# Patient Record
Sex: Male | Born: 1965 | Race: Black or African American | Hispanic: No | Marital: Single | Smoking: Light tobacco smoker
Health system: Southern US, Community
[De-identification: ages and names within clinical notes are randomized; demographics above are authoritative.]

## PROBLEM LIST (undated history)

## (undated) DIAGNOSIS — I10 Essential (primary) hypertension: Secondary | ICD-10-CM

## (undated) DIAGNOSIS — F639 Impulse disorder, unspecified: Secondary | ICD-10-CM

## (undated) DIAGNOSIS — R569 Unspecified convulsions: Secondary | ICD-10-CM

## (undated) DIAGNOSIS — E119 Type 2 diabetes mellitus without complications: Secondary | ICD-10-CM

## (undated) DIAGNOSIS — F79 Unspecified intellectual disabilities: Secondary | ICD-10-CM

## (undated) DIAGNOSIS — F602 Antisocial personality disorder: Secondary | ICD-10-CM

## (undated) DIAGNOSIS — F919 Conduct disorder, unspecified: Secondary | ICD-10-CM

## (undated) DIAGNOSIS — F71 Moderate intellectual disabilities: Secondary | ICD-10-CM

## (undated) DIAGNOSIS — N183 Chronic kidney disease, stage 3 unspecified: Secondary | ICD-10-CM

## (undated) DIAGNOSIS — F809 Developmental disorder of speech and language, unspecified: Secondary | ICD-10-CM

## (undated) DIAGNOSIS — J4520 Mild intermittent asthma, uncomplicated: Secondary | ICD-10-CM

## (undated) HISTORY — DX: Mild intermittent asthma, uncomplicated: J45.20

## (undated) HISTORY — DX: Essential (primary) hypertension: I10

## (undated) HISTORY — DX: Type 2 diabetes mellitus without complications: E11.9

## (undated) HISTORY — DX: Chronic kidney disease, stage 3 unspecified: N18.30

## (undated) HISTORY — DX: Chronic kidney disease, stage 3 (moderate): N18.3

---

## 1999-09-06 ENCOUNTER — Emergency Department (HOSPITAL_COMMUNITY): Admission: EM | Admit: 1999-09-06 | Discharge: 1999-09-06 | Payer: Self-pay | Admitting: Emergency Medicine

## 1999-09-27 ENCOUNTER — Emergency Department (HOSPITAL_COMMUNITY): Admission: EM | Admit: 1999-09-27 | Discharge: 1999-09-27 | Payer: Self-pay

## 2000-10-25 ENCOUNTER — Encounter: Payer: Self-pay | Admitting: Specialist

## 2000-10-25 ENCOUNTER — Encounter: Payer: Self-pay | Admitting: Emergency Medicine

## 2000-10-25 ENCOUNTER — Emergency Department (HOSPITAL_COMMUNITY): Admission: EM | Admit: 2000-10-25 | Discharge: 2000-10-25 | Payer: Self-pay | Admitting: Emergency Medicine

## 2002-09-11 ENCOUNTER — Emergency Department (HOSPITAL_COMMUNITY): Admission: EM | Admit: 2002-09-11 | Discharge: 2002-09-11 | Payer: Self-pay | Admitting: Emergency Medicine

## 2002-12-04 ENCOUNTER — Emergency Department (HOSPITAL_COMMUNITY): Admission: EM | Admit: 2002-12-04 | Discharge: 2002-12-04 | Payer: Self-pay | Admitting: Emergency Medicine

## 2004-12-14 ENCOUNTER — Emergency Department (HOSPITAL_COMMUNITY): Admission: EM | Admit: 2004-12-14 | Discharge: 2004-12-14 | Payer: Self-pay | Admitting: Emergency Medicine

## 2005-11-20 ENCOUNTER — Emergency Department (HOSPITAL_COMMUNITY): Admission: EM | Admit: 2005-11-20 | Discharge: 2005-11-20 | Payer: Self-pay | Admitting: Emergency Medicine

## 2006-03-09 ENCOUNTER — Ambulatory Visit (HOSPITAL_BASED_OUTPATIENT_CLINIC_OR_DEPARTMENT_OTHER): Admission: RE | Admit: 2006-03-09 | Discharge: 2006-03-09 | Payer: Self-pay | Admitting: Family Medicine

## 2006-03-14 ENCOUNTER — Ambulatory Visit: Payer: Self-pay | Admitting: Internal Medicine

## 2006-05-12 ENCOUNTER — Encounter: Admission: RE | Admit: 2006-05-12 | Discharge: 2006-08-10 | Payer: Self-pay | Admitting: Family Medicine

## 2006-08-13 ENCOUNTER — Encounter: Admission: RE | Admit: 2006-08-13 | Discharge: 2006-11-11 | Payer: Self-pay | Admitting: Family Medicine

## 2008-10-11 ENCOUNTER — Encounter: Admission: RE | Admit: 2008-10-11 | Discharge: 2008-10-11 | Payer: Self-pay | Admitting: Family Medicine

## 2009-01-31 ENCOUNTER — Emergency Department (HOSPITAL_COMMUNITY): Admission: EM | Admit: 2009-01-31 | Discharge: 2009-01-31 | Payer: Self-pay | Admitting: Family Medicine

## 2009-10-08 ENCOUNTER — Emergency Department (HOSPITAL_COMMUNITY): Admission: EM | Admit: 2009-10-08 | Discharge: 2009-10-08 | Payer: Self-pay | Admitting: Emergency Medicine

## 2010-01-10 ENCOUNTER — Emergency Department (HOSPITAL_COMMUNITY): Admission: EM | Admit: 2010-01-10 | Discharge: 2010-01-10 | Payer: Self-pay | Admitting: Emergency Medicine

## 2010-10-17 NOTE — Procedures (Signed)
NAME:  Mark Escobar, Mark Escobar               ACCOUNT NO.:  [id number]   MEDICAL RECORD NO.:  [id number]          PATIENT TYPE:  OUT   LOCATION:  SLEEP CENTER                 FACILITY:  [facility]   PHYSICIAN:  Clinton D. Maple Hudson, MD, FCCP, FACPDATE OF BIRTH:  08/30/65   DATE OF STUDY:  03/09/2006                              NOCTURNAL POLYSOMNOGRAM   REFERRING PHYSICIAN:  Dr. Windle Guard   INDICATION FOR STUDY:  Hypersomnia with sleep apnea.   EPWORTH SLEEPINESS SCORE:  11/24, BMI 34.9, weight 210 pounds.   MEDICATION LIST:  Is charted.   SLEEP ARCHITECTURE:  Total sleep time 409 minute with sleep efficiency 87%.  Stage 1 was 18%, stage 2 62%, stages 3 and 4 were absent.  Sleep latency was  30 minutes, REM latency was 120 minutes, awake after sleep onset 32 minutes,  arousal index 28.5 which is somewhat increased, indicating increased sleep  fragmentation.  No bedtime medication was reported.   RESPIRATORY DATA:  Split study protocol.  Apnea/hypopnea index (AHI, RDI)  94.9 obstructive events per hour, indicating severe obstructive sleep  apnea/hypopnea syndrome before CPAP.  This included 156 obstructive apneas  and 59 hypopneas before CPAP.  The events were not positional.  REM AHI  14.5.  CPAP was titrated to 12 CWP for control of obstructive events, AHI  zero per hour.  The technician took the pressure up to 24 CWP trying to  completely stop snoring.   OXYGEN DATA:  Loud snoring with oxygen desaturation to a nadir of 58%.  After CPAP control, saturation held 92-95% on room air.   CARDIAC DATA:  Normal sinus rhythm with occasional PVC and PAC.   MOVEMENT/PARASOMNIA:  Occasional limb jerk, insignificant.   IMPRESSION/RECOMMENDATION:  1. Severe obstructive sleep apnea/hypopnea syndrome, apnea/hypopnea index      94.9 per hour with nonpositional events, loud snoring, and oxygen      desaturation to 58%.  2. Successful CPAP titration.  Recommend initial trial at 14 CWP which      should  be sufficient to control obstructive events, normalize oxygen      saturation, and substantially reduce snoring.  Higher pressures are      likely      to be uncomfortable beyond benefit to patient.  A medium Comfort full      face mask was used with heated humidifier.      Clinton D. Maple Hudson, MD, [facility], FACP  Diplomate, Biomedical engineer of Sleep Medicine  Electronically Signed     CDY/MEDQ  D:  03/14/2006 11:05:24  T:  03/15/2006 12:13:09  Job:  [id number]

## 2014-02-07 ENCOUNTER — Encounter (HOSPITAL_COMMUNITY): Payer: Self-pay | Admitting: Emergency Medicine

## 2014-02-07 ENCOUNTER — Emergency Department (HOSPITAL_COMMUNITY)
Admission: EM | Admit: 2014-02-07 | Discharge: 2014-02-08 | Disposition: A | Discharge status: Home / Self Care | Payer: Medicare Other | Attending: Emergency Medicine | Admitting: Emergency Medicine

## 2014-02-07 DIAGNOSIS — IMO0002 Reserved for concepts with insufficient information to code with codable children: Secondary | ICD-10-CM | POA: Insufficient documentation

## 2014-02-07 DIAGNOSIS — F17200 Nicotine dependence, unspecified, uncomplicated: Secondary | ICD-10-CM | POA: Insufficient documentation

## 2014-02-07 DIAGNOSIS — Z8669 Personal history of other diseases of the nervous system and sense organs: Secondary | ICD-10-CM | POA: Diagnosis not present

## 2014-02-07 DIAGNOSIS — R45851 Suicidal ideations: Secondary | ICD-10-CM | POA: Insufficient documentation

## 2014-02-07 DIAGNOSIS — R451 Restlessness and agitation: Secondary | ICD-10-CM

## 2014-02-07 DIAGNOSIS — F4324 Adjustment disorder with disturbance of conduct: Secondary | ICD-10-CM

## 2014-02-07 HISTORY — DX: Unspecified intellectual disabilities: F79

## 2014-02-07 HISTORY — DX: Moderate intellectual disabilities: F71

## 2014-02-07 HISTORY — DX: Unspecified convulsions: R56.9

## 2014-02-07 HISTORY — DX: Conduct disorder, unspecified: F91.9

## 2014-02-07 HISTORY — DX: Developmental disorder of speech and language, unspecified: F80.9

## 2014-02-07 HISTORY — DX: Impulse disorder, unspecified: F63.9

## 2014-02-07 HISTORY — DX: Antisocial personality disorder: F60.2

## 2014-02-07 LAB — RAPID URINE DRUG SCREEN, HOSP PERFORMED
Amphetamines: NOT DETECTED
BARBITURATES: NOT DETECTED
Benzodiazepines: NOT DETECTED
Cocaine: NOT DETECTED
Opiates: NOT DETECTED
Tetrahydrocannabinol: NOT DETECTED

## 2014-02-07 LAB — I-STAT CHEM 8, ED
BUN: 8 mg/dL (ref 6–23)
CHLORIDE: 106 meq/L (ref 96–112)
Calcium, Ion: 1.14 mmol/L (ref 1.12–1.23)
Creatinine, Ser: 0.8 mg/dL (ref 0.50–1.35)
Glucose, Bld: 204 mg/dL — ABNORMAL HIGH (ref 70–99)
HCT: 43 % (ref 39.0–52.0)
Hemoglobin: 14.6 g/dL (ref 13.0–17.0)
Potassium: 3.7 mEq/L (ref 3.7–5.3)
Sodium: 142 mEq/L (ref 137–147)
TCO2: 23 mmol/L (ref 0–100)

## 2014-02-07 LAB — ETHANOL: Alcohol, Ethyl (B): <11 mg/dL (ref 0–11)

## 2014-02-07 MED ORDER — ACETAMINOPHEN 325 MG PO TABS: 650.0000 mg | ORAL_TABLET | ORAL | Status: DC | PRN

## 2014-02-07 MED ORDER — IBUPROFEN 200 MG PO TABS: 400.0000 mg | ORAL_TABLET | Freq: Three times a day (TID) | ORAL | Status: DC | PRN

## 2014-02-07 MED ORDER — ALUM & MAG HYDROXIDE-SIMETH 200-200-20 MG/5ML PO SUSP: 30.0000 mL | ORAL | Status: DC | PRN

## 2014-02-07 MED ORDER — NICOTINE 21 MG/24HR TD PT24: 21.0000 mg | MEDICATED_PATCH | Freq: Every day | TRANSDERMAL | Status: DC

## 2014-02-07 MED ORDER — LORAZEPAM 1 MG PO TABS: 1.0000 mg | ORAL_TABLET | Freq: Three times a day (TID) | ORAL | Status: DC | PRN

## 2014-02-07 MED ORDER — ONDANSETRON HCL 4 MG PO TABS: 4.0000 mg | ORAL_TABLET | Freq: Three times a day (TID) | ORAL | Status: DC | PRN

## 2014-02-07 NOTE — ED Notes (Signed)
Group Home: [facility] [employer] [facility]) [phone] (cell) [phone] (office) [place]

## 2014-02-07 NOTE — ED Provider Notes (Signed)
CSN: [id number]     Arrival date & time 02/07/14  2147 History   First MD Initiated Contact with Patient 02/07/14 2153     Chief Complaint  Patient presents with  . Suicidal      The history is provided by the patient, the police and a caregiver. The history is limited by the condition of the patient (Hx MR).  Pt was seen at 2155. Per Police, pt's caregivers and pt:  Caregivers called Police due to pt "running away from his apartment." Police convinced pt to go back to his apartment, then he left again. When Police caught up with him again, pt was "hitting himself in his head with his arms," and then "he picked up a jagged rock and ran it across his wrists trying to cut himself." Police took out IVC paperwork. Pt states he "just wanted to smoke" which he is not allowed to do according to his caregiver.   Past Medical History  Diagnosis Date  . Seizures   . Conduct disorder   . Impulse control disorder   . Antisocial personality disorder   . Communication disability   . Moderate mental retardation   . Mental retardation    History reviewed. No pertinent past surgical history.  History  Substance Use Topics  . Smoking status: Light Tobacco Smoker  . Smokeless tobacco: Not on file  . Alcohol Use: No    Review of Systems  Unable to perform ROS: Psychiatric disorder     Allergies  Review of patient's allergies indicates no known allergies.  Home Medications   Prior to Admission medications   Not on File   BP 154/87  Pulse 115  Temp(Src) 98.2 F (36.8 C) (Oral)  Resp 18  SpO2 96% Physical Exam 2200: Physical examination:  Nursing notes reviewed; Vital signs and O2 SAT reviewed;  Constitutional: Well developed, Well nourished, Well hydrated, In no acute distress; Head:  Normocephalic, atraumatic; Eyes: EOMI, PERRL, No scleral icterus; ENMT: Mouth and pharynx normal, Mucous membranes moist; Neck: Supple, Full range of motion, No lymphadenopathy; Cardiovascular: Regular rate  and rhythm, No murmur, rub, or gallop; Respiratory: Breath sounds clear & equal bilaterally, No rales, rhonchi, wheezes. Normal respiratory effort/excursion; Chest: Nontender, Movement normal; Abdomen: Soft, Nontender, Nondistended, Normal bowel sounds;; Extremities: Pulses normal, No tenderness, No edema, No calf edema or asymmetry.; Neuro: Awake, alert, vague historian. Major CN grossly intact. Speech minimal. No gross focal motor or sensory deficits in extremities. Climbs on and off stretcher easily by herself. Gait steady.; Skin: Color normal, Warm, Dry.; Psych:  Affect flat, poor eye contact.    ED Course  Procedures     MDM  MDM Reviewed: previous chart, nursing note and vitals Reviewed previous: labs Interpretation: labs    Results for orders placed during the hospital encounter of 02/07/14  ETHANOL      Result Value Ref Range   Alcohol, Ethyl (B) <11  0 - 11 mg/dL  I-STAT CHEM 8, ED      Result Value Ref Range   Sodium 142  137 - 147 mEq/L   Potassium 3.7  3.7 - 5.3 mEq/L   Chloride 106  96 - 112 mEq/L   BUN 8  6 - 23 mg/dL   Creatinine, Ser 0.80  0.50 - 1.35 mg/dL   Glucose, Bld 204 (*) 70 - 99 mg/dL   Calcium, Ion 1.14  1.12 - 1.23 mmol/L   TCO2 23  0 - 100 mmol/L   Hemoglobin 14.6  13.0 -  17.0 g/dL   HCT 43.0  39.0 - 52.0 %    2325:  TTS eval pending. Holding orders written.      Francine Graven, DO 02/07/14 2330

## 2014-02-07 NOTE — ED Notes (Addendum)
Pt. In scrubs.pt. And belongings searched and wanded by security. Pt. Has 1 belongings bag.pt. Has black shoes, shorts,socks, blue sweat pants, black jacket,gray t-shirt, black watch, gray toboggan and money($27.00 dollars cash), 4 necklaces, 2 bracelets, 5 rings, 2 watches,visa, debit card and 45 cents and keys. Pt. Belongings locked up at the nurses station in triage.

## 2014-02-07 NOTE — ED Notes (Signed)
Per NP,Gail pt. Given water.

## 2014-02-07 NOTE — ED Notes (Signed)
Pt arrived to the ED with a complaint of suicidal ideations.  Pt was found by police running from his apartment.  Pt has mental retardation.  Police were called by the pt's caregiver.  Police were able to convince pt to return to apartment where he once again ran away.  Police pursued and when they caught up with him he was hitting himself in the head with his arms.  Police then noted that patient picked up a jagged rock and ran it across his wrists.  Police are taking out IVC paperwork.  Pt states that he was only trying to get out to smoke which according to his care giver he is not allowed to do.

## 2014-02-08 ENCOUNTER — Emergency Department (HOSPITAL_COMMUNITY): Payer: Medicare Other

## 2014-02-08 ENCOUNTER — Encounter (HOSPITAL_COMMUNITY): Payer: Self-pay | Admitting: Registered Nurse

## 2014-02-08 DIAGNOSIS — F4324 Adjustment disorder with disturbance of conduct: Secondary | ICD-10-CM | POA: Diagnosis present

## 2014-02-08 DIAGNOSIS — IMO0002 Reserved for concepts with insufficient information to code with codable children: Secondary | ICD-10-CM | POA: Diagnosis not present

## 2014-02-08 LAB — COMPREHENSIVE METABOLIC PANEL
ALK PHOS: 81 U/L (ref 39–117)
ALT: 41 U/L (ref 0–53)
AST: 43 U/L — ABNORMAL HIGH (ref 0–37)
Albumin: 3.9 g/dL (ref 3.5–5.2)
Anion gap: 14 (ref 5–15)
BILIRUBIN TOTAL: 0.3 mg/dL (ref 0.3–1.2)
BUN: 10 mg/dL (ref 6–23)
CO2: 27 mEq/L (ref 19–32)
CREATININE: 0.79 mg/dL (ref 0.50–1.35)
Calcium: 9.3 mg/dL (ref 8.4–10.5)
Chloride: 103 mEq/L (ref 96–112)
GFR calc Af Amer: >90 mL/min (ref >90)
Glucose, Bld: 182 mg/dL — ABNORMAL HIGH (ref 70–99)
Potassium: 3.8 mEq/L (ref 3.7–5.3)
Sodium: 144 mEq/L (ref 137–147)
Total Protein: 7.3 g/dL (ref 6.0–8.3)

## 2014-02-08 LAB — CBC WITH DIFFERENTIAL/PLATELET
Basophils Absolute: 0 10*3/uL (ref 0.0–0.1)
Basophils Relative: 1 % (ref 0–1)
Eosinophils Absolute: 0.2 10*3/uL (ref 0.0–0.7)
Eosinophils Relative: 3 % (ref 0–5)
HCT: 39.1 % (ref 39.0–52.0)
HEMOGLOBIN: 13.1 g/dL (ref 13.0–17.0)
LYMPHS ABS: 2.2 10*3/uL (ref 0.7–4.0)
Lymphocytes Relative: 38 % (ref 12–46)
MCH: 28.5 pg (ref 26.0–34.0)
MCHC: 33.5 g/dL (ref 30.0–36.0)
MCV: 85.2 fL (ref 78.0–100.0)
MONO ABS: 0.4 10*3/uL (ref 0.1–1.0)
MONOS PCT: 7 % (ref 3–12)
NEUTROS ABS: 3 10*3/uL (ref 1.7–7.7)
Neutrophils Relative %: 51 % (ref 43–77)
Platelets: 320 10*3/uL (ref 150–400)
RBC: 4.59 MIL/uL (ref 4.22–5.81)
RDW: 13.8 % (ref 11.5–15.5)
WBC: 5.8 10*3/uL (ref 4.0–10.5)

## 2014-02-08 LAB — TROPONIN I

## 2014-02-08 MED ORDER — NITROGLYCERIN 0.4 MG SL SUBL: 0.4000 mg | SUBLINGUAL_TABLET | SUBLINGUAL | Status: DC | PRN

## 2014-02-08 MED ORDER — ASPIRIN 81 MG PO CHEW: 324.0000 mg | CHEWABLE_TABLET | Freq: Once | ORAL | Status: DC

## 2014-02-08 NOTE — ED Notes (Signed)
Reeves Forth charge RN will walk with pt and caregiver to obtain his belongings from safe at security office.

## 2014-02-08 NOTE — BH Assessment (Signed)
[facility] Assessment Progress Note      Attempted to contact pt's group home to notify of discharge.  Was not able to make contact.  Left Message.  Pt was evaluated by Dr Donnelly Angelica and Earleen Newport, NP and psychiatrically cleared for discharge.  He reports that he has a hard time with his 1 to 1 care giver because he yells at him.  This Probation officer will discuss the possibility of changing care providers with his group home.

## 2014-02-08 NOTE — [facility] Suicide Risk Assessment (Cosign Needed)
Suicide Risk Assessment  Discharge Assessment     Demographic Factors:  Male and Black  Total Time spent with patient: 30 minutes  Psychiatric Specialty Exam:     Blood pressure 163/94, pulse 76, temperature 98.6 F (37 C), temperature source Oral, resp. rate 22, SpO2 96.00%.There is no height or weight on file to calculate BMI.   General Appearance: Casual   Eye Contact:: Good   Speech: Clear and Coherent and Normal Rate   Volume: Normal   Mood: Depressed   Affect: Congruent   Thought Process: Circumstantial   Orientation: Full (Time, Place, and Person)   Thought Content: "I don't like my one to one he mean"   Suicidal Thoughts: No   Homicidal Thoughts: No   Memory: Immediate; Fair  Recent; Fair  Remote; Fair   Judgement: Other: Mental retardation. Fair judgement   Insight: Fair   Psychomotor Activity: Normal   Concentration: Fair   Recall: [employer] of Knowledge:Poor   Language: Poor   Akathisia: No   Handed: Right   AIMS (if indicated):   Assets: Communication Skills  Desire for Improvement  Housing   Sleep:   Musculoskeletal:  Strength & Muscle Tone: within normal limits  Gait & Station: normal  Patient leans: N/A  Mental Status Per Nursing Assessment::   On Admission:     Current Mental Status by Physician: Patient denies suicidal/homicidal ideation, psychosis, and paranoia  Loss Factors: NA  Historical Factors: NA  Risk Reduction Factors:   Living with another person, especially a relative and Positive social support  Continued Clinical Symptoms:  Mental Retardation  Cognitive Features That Contribute To Risk:  Loss of executive function    Suicide Risk:  Minimal: No identifiable suicidal ideation.  Patients presenting with no risk factors but with morbid ruminations; may be classified as minimal risk based on the severity of the depressive symptoms  Discharge Diagnoses:  AXIS I: Adjustment Disorder with Disturbance of Conduct  AXIS II:  Deferred  AXIS III:  Past Medical History   Diagnosis  Date   .  Seizures    .  Conduct disorder    .  Impulse control disorder    .  Antisocial personality disorder    .  Communication disability    .  Moderate mental retardation    .  Mental retardation     AXIS IV: other psychosocial or environmental problems  AXIS V: 61-70 mild symptoms  Plan Of Care/Follow-up recommendations:  Activity:  as tolerated Diet:  as tolerated  Is patient on multiple antipsychotic therapies at discharge:  No   Has Patient had three or more failed trials of antipsychotic monotherapy by history:  No  Recommended Plan for Multiple Antipsychotic Therapies: NA    Briya Lookabaugh, FNP-BC 02/08/2014, 2:28 PM

## 2014-02-08 NOTE — ED Notes (Signed)
Copy of IVC papers faxed to [place] at [facility]. Tele-assessment machine placed at patient bedside.

## 2014-02-08 NOTE — Consult Note (Signed)
[facility] Face-to-Face Psychiatry Consult   Reason for Consult:  Run away from group home Referring Physician:  EDP  Mark Escobar is an 48 y.o. male. Total Time spent with patient: 45 minutes  Assessment: AXIS I:  Adjustment Disorder with Disturbance of Conduct AXIS II:  Deferred AXIS III:   Past Medical History  Diagnosis Date  . Seizures   . Conduct disorder   . Impulse control disorder   . Antisocial personality disorder   . Communication disability   . Moderate mental retardation   . Mental retardation    AXIS IV:  other psychosocial or environmental problems AXIS V:  61-70 mild symptoms  Plan:  No evidence of imminent risk to self or others at present.   Patient does not meet criteria for psychiatric inpatient admission. Supportive therapy provided about ongoing stressors. Discussed crisis plan, support from social network, calling 911, coming to the Emergency Department, and calling Suicide Hotline.  Subjective:   Mark Escobar is a 48 y.o. male patient.  HPI:  Patient states "I ran away from home cause I mad at my one to one (a person who sits with patient).  They said I was smoking; they lie; I not smoke; somebody told a lie. My one to one yelling and put his hand in my face.  I left again; they call the police and I do this with rock (patient held his had to his arm like he was going to this).  Patient states that he doesn't want to hurt himself or anyone else.  Patient states that he doesn't like his one to one because he is mean.  States that he likes everyone else.  Patient denies psychosis, and paranoia.  HPI Elements:   Location:  Adjustment disorder. Quality:  run away. Severity:  tried to cut self with rock. Timing:  1 day. Review of Systems  HENT: Negative.   Respiratory: Negative.   Musculoskeletal: Negative.   Psychiatric/Behavioral: Negative for suicidal ideas, hallucinations, memory loss and substance abuse. The patient is not nervous/anxious and does not  have insomnia.   History reviewed. No pertinent family history.   Past Psychiatric History: Past Medical History  Diagnosis Date  . Seizures   . Conduct disorder   . Impulse control disorder   . Antisocial personality disorder   . Communication disability   . Moderate mental retardation   . Mental retardation     reports that he has been smoking.  He does not have any smokeless tobacco history on file. He reports that he does not drink alcohol or use illicit drugs. History reviewed. No pertinent family history. Family History Substance Abuse: No Family Supports: No (None reported ) Living Arrangements: Other (Comment) (Lives in group home--[facility] ) Can pt return to current living arrangement?: Yes Abuse/Neglect [facility]) Physical Abuse: Denies Verbal Abuse: Denies Sexual Abuse: Denies Allergies:  No Known Allergies  ACT Assessment Complete:  Yes:    Educational Status    Risk to Self: Risk to self with the past 6 months Suicidal Ideation: Yes-Currently Present Suicidal Intent: Yes-Currently Present Is patient at risk for suicide?: Yes Suicidal Plan?: Yes-Currently Present Specify Current Suicidal Plan: Hit self in the head; he was found cutting himself with jagged rocks  Access to Means: Yes Specify Access to Suicidal Means: [place] What has been your use of drugs/alcohol within the last 12 months?: None  Previous Attempts/Gestures: No How many times?: 0 Other Self Harm Risks: None  Triggers for Past Attempts: Unknown  Intentional Self Injurious Behavior: None Family Suicide History: No Recent stressful life event(s): Conflict (Comment) (Issues with group home staff ) Persecutory voices/beliefs?: No Depression: Yes Depression Symptoms: Feeling angry/irritable Substance abuse history and/or treatment for substance abuse?: No Suicide prevention information given to non-admitted patients: Not applicable  Risk to Others: Risk to Others within the past 6  months Homicidal Ideation: No Thoughts of Harm to Others: No Current Homicidal Intent: No Current Homicidal Plan: No Access to Homicidal Means: No Identified Victim: None  History of harm to others?: No Assessment of Violence: None Noted Violent Behavior Description: None  Does patient have access to weapons?: No Criminal Charges Pending?: No Does patient have a court date: No  Abuse: Abuse/Neglect Assessment (Assessment to be complete while patient is alone) Physical Abuse: Denies Verbal Abuse: Denies Sexual Abuse: Denies Exploitation of patient/patient's resources: Denies Self-Neglect: Denies  Prior Inpatient Therapy: Prior Inpatient Therapy Prior Inpatient Therapy: Yes Prior Therapy Dates: Unk  Prior Therapy Facilty/Provider(s): [facility]  Reason for Treatment: Mental health   Prior Outpatient Therapy: Prior Outpatient Therapy Prior Outpatient Therapy: No Prior Therapy Dates: Unk  Prior Therapy Facilty/Provider(s): [facility]  Reason for Treatment: Unk   Additional Information: Additional Information 1:1 In Past 12 Months?: No CIRT Risk: No Elopement Risk: No Does patient have medical clearance?: Yes                  Objective: Blood pressure 163/94, pulse 76, temperature 98.6 F (37 C), temperature source Oral, resp. rate 22, SpO2 96.00%.There is no height or weight on file to calculate BMI. Results for orders placed during the hospital encounter of 02/07/14 (from the past 72 hour(s))  ETHANOL     Status: None   Collection Time    02/07/14 10:47 PM      Result Value Ref Range   Alcohol, Ethyl (B) <11  0 - 11 mg/dL   Comment:            LOWEST DETECTABLE LIMIT FOR     SERUM ALCOHOL IS 11 mg/dL     FOR MEDICAL PURPOSES ONLY  I-STAT CHEM 8, ED     Status: Abnormal   Collection Time    02/07/14 10:56 PM      Result Value Ref Range   Sodium 142  137 - 147 mEq/L   Potassium 3.7  3.7 - 5.3 mEq/L   Chloride 106  96 - 112 mEq/L   BUN 8  6 - 23 mg/dL    Creatinine, Ser 8.23  0.50 - 1.35 mg/dL   Glucose, Bld 988 (*) 70 - 99 mg/dL   Calcium, Ion 3.00  3.30 - 1.23 mmol/L   TCO2 23  0 - 100 mmol/L   Hemoglobin 14.6  13.0 - 17.0 g/dL   HCT 89.9  71.6 - 74.9 %  URINE RAPID DRUG SCREEN (HOSP PERFORMED)     Status: None   Collection Time    02/07/14 11:29 PM      Result Value Ref Range   Opiates NONE DETECTED  NONE DETECTED   Cocaine NONE DETECTED  NONE DETECTED   Benzodiazepines NONE DETECTED  NONE DETECTED   Amphetamines NONE DETECTED  NONE DETECTED   Tetrahydrocannabinol NONE DETECTED  NONE DETECTED   Barbiturates NONE DETECTED  NONE DETECTED   Comment:            DRUG SCREEN FOR MEDICAL PURPOSES     ONLY.  IF CONFIRMATION IS NEEDED     FOR ANY PURPOSE, NOTIFY  LAB     WITHIN 5 DAYS.                LOWEST DETECTABLE LIMITS     FOR URINE DRUG SCREEN     Drug Class       Cutoff (ng/mL)     Amphetamine      1000     Barbiturate      200     Benzodiazepine   200     Tricyclics       300     Opiates          300     Cocaine          300     THC              50  TROPONIN I     Status: None   Collection Time    02/08/14 11:44 AM      Result Value Ref Range   Troponin I <0.30  <0.30 ng/mL   Comment:            Due to the release kinetics of cTnI,     a negative result within the first hours     of the onset of symptoms does not rule out     myocardial infarction with certainty.     If myocardial infarction is still suspected,     repeat the test at appropriate intervals.  CBC WITH DIFFERENTIAL     Status: None   Collection Time    02/08/14 11:44 AM      Result Value Ref Range   WBC 5.8  4.0 - 10.5 K/uL   RBC 4.59  4.22 - 5.81 MIL/uL   Hemoglobin 13.1  13.0 - 17.0 g/dL   HCT 62.8  27.4 - 07.1 %   MCV 85.2  78.0 - 100.0 fL   MCH 28.5  26.0 - 34.0 pg   MCHC 33.5  30.0 - 36.0 g/dL   RDW 84.5  18.6 - 21.0 %   Platelets 320  150 - 400 K/uL   Neutrophils Relative % 51  43 - 77 %   Neutro Abs 3.0  1.7 - 7.7 K/uL   Lymphocytes  Relative 38  12 - 46 %   Lymphs Abs 2.2  0.7 - 4.0 K/uL   Monocytes Relative 7  3 - 12 %   Monocytes Absolute 0.4  0.1 - 1.0 K/uL   Eosinophils Relative 3  0 - 5 %   Eosinophils Absolute 0.2  0.0 - 0.7 K/uL   Basophils Relative 1  0 - 1 %   Basophils Absolute 0.0  0.0 - 0.1 K/uL  COMPREHENSIVE METABOLIC PANEL     Status: Abnormal   Collection Time    02/08/14 11:44 AM      Result Value Ref Range   Sodium 144  137 - 147 mEq/L   Potassium 3.8  3.7 - 5.3 mEq/L   Chloride 103  96 - 112 mEq/L   CO2 27  19 - 32 mEq/L   Glucose, Bld 182 (*) 70 - 99 mg/dL   BUN 10  6 - 23 mg/dL   Creatinine, Ser 9.02  0.50 - 1.35 mg/dL   Calcium 9.3  8.4 - 27.7 mg/dL   Total Protein 7.3  6.0 - 8.3 g/dL   Albumin 3.9  3.5 - 5.2 g/dL   AST 43 (*) 0 - 37 U/L   ALT 41  0 - 53 U/L  Alkaline Phosphatase 81  39 - 117 U/L   Total Bilirubin 0.3  0.3 - 1.2 mg/dL   GFR calc non Af Amer >90  >90 mL/min   GFR calc Af Amer >90  >90 mL/min   Comment: (NOTE)     The eGFR has been calculated using the CKD EPI equation.     This calculation has not been validated in all clinical situations.     eGFR's persistently <90 mL/min signify possible Chronic Kidney     Disease.   Anion gap 14  5 - 15   Labs are reviewed see above values.  Medications reviewed and no changes made  Current Facility-Administered Medications  Medication Dose Route Frequency Provider Last Rate Last Dose  . acetaminophen (TYLENOL) tablet 650 mg  650 mg Oral Q4H PRN Francine Graven, DO      . alum & mag hydroxide-simeth (MAALOX/MYLANTA) 200-200-20 MG/5ML suspension 30 mL  30 mL Oral PRN Francine Graven, DO      . aspirin chewable tablet 324 mg  324 mg Oral Once Pamella Pert, MD      . ibuprofen (ADVIL,MOTRIN) tablet 400 mg  400 mg Oral Q8H PRN Francine Graven, DO      . LORazepam (ATIVAN) tablet 1 mg  1 mg Oral Q8H PRN Francine Graven, DO      . nicotine (NICODERM CQ - dosed in mg/24 hours) patch 21 mg  21 mg Transdermal Daily Francine Graven, DO      . nitroGLYCERIN (NITROSTAT) SL tablet 0.4 mg  0.4 mg Sublingual Q5 min PRN Pamella Pert, MD      . ondansetron [facility]) tablet 4 mg  4 mg Oral Q8H PRN Francine Graven, DO       No current outpatient prescriptions on file.    Psychiatric Specialty Exam:     Blood pressure 163/94, pulse 76, temperature 98.6 F (37 C), temperature source Oral, resp. rate 22, SpO2 96.00%.There is no height or weight on file to calculate BMI.  General Appearance: Casual  Eye Contact::  Good  Speech:  Clear and Coherent and Normal Rate  Volume:  Normal  Mood:  Depressed  Affect:  Congruent  Thought Process:  Circumstantial  Orientation:  Full (Time, Place, and Person)  Thought Content:  "I don't like my one to one he mean"  Suicidal Thoughts:  No  Homicidal Thoughts:  No  Memory:  Immediate;   Fair Recent;   Fair Remote;   Fair  Judgement:  Other:  Mental retardation.  Fair judgement  Insight:  Fair  Psychomotor Activity:  Normal  Concentration:  Fair  Recall:  [employer] of Knowledge:Poor  Language: Poor  Akathisia:  No  Handed:  Right  AIMS (if indicated):     Assets:  Communication Skills Desire for Improvement Housing  Sleep:      Musculoskeletal: Strength & Muscle Tone: within normal limits Gait & Station: normal Patient leans: N/A  Treatment Plan Summary: Discharge back to group home.  Patient to follow up with primary outpatient provider   Earleen Newport, FNP-BC 02/08/2014 2:10 PM

## 2014-02-08 NOTE — ED Notes (Signed)
Called lab to inquire about troponin and CMP results, they stated it will result in a few minutes.

## 2014-02-08 NOTE — Discharge Instructions (Signed)
Adjustment Disorder °Most changes in life can cause stress. Getting used to changes may take a few months or longer. If feelings of stress, hopelessness, or worry continue, you may have an adjustment disorder. This stress-related mental health problem may affect your feelings, thinking and how you act. It occurs in both sexes and happens at any age. °SYMPTOMS  °Some of the following problems may be seen and vary from person to person: °· Sadness or depression. °· Loss of enjoyment. °· Thoughts of suicide. °· Fighting. °· Avoiding family and friends. °· Poor school performance. °· Hopelessness, sense of loss. °· Trouble sleeping. °· Vandalism. °· Worry, weight loss or gain. °· Crying spells. °· Anxiety °· Reckless driving. °· Skipping school. °· Poor work performance. °· Nervousness. °· Ignoring bills. °· Poor attitude. °DIAGNOSIS  °Your caregiver will ask what has happened in your life and do a physical exam. They will make a diagnosis of an adjustment disorder when they are sure another problem or medical illness causing your feelings does not exist. °TREATMENT  °When problems caused by stress interfere with you daily life or last longer than a few months, you may need counseling for an adjustment disorder. Early treatment may diminish problems and help you to better cope with the stressful events in your life. Sometimes medication is necessary. Individual counseling and or support groups can be very helpful. °PROGNOSIS  °Adjustment disorders usually last less than 3 to 6 months. The condition may persist if there is long lasting stress. This could include health problems, relationship problems, or job difficulties where you can not easily escape from what is causing the problem. °PREVENTION  °Even the most mentally healthy, highly functioning people can suffer from an adjustment disorder given a significant blow from a life-changing event. There is no way to prevent pain and loss. Most people need help from time  to time. You are not alone. °SEEK MEDICAL CARE IF:  °Your feelings or symptoms listed above do not improve or worsen. °Document Released: 01/20/2006 Document Revised: 08/10/2011 Document Reviewed: 04/13/2007 °ExitCare® Patient Information ©2015 [employer]. This information is not intended to replace advice given to you by your health care provider. Make sure you discuss any questions you have with your health care provider. ° °

## 2014-02-08 NOTE — ED Notes (Signed)
Pt denies chest pain at this time, sts it only hurts if he's touching his ribs.

## 2014-02-08 NOTE — ED Notes (Signed)
Dr Aline Brochure at bedside to evaluate pt.

## 2014-02-08 NOTE — ED Notes (Signed)
Pt at this time c/o left sided rib cage pain; he is tender to touch; sts pain is worse with taking a deep breath. Dr Aline Brochure notified, VO received for EKG, blood work and nitroglycerin PRN.

## 2014-02-08 NOTE — BH Assessment (Signed)
Tele Assessment Note   Mark Escobar is a 48 y.o. male who presents to [facility] via IVC petition, initiated by police.  Pt has mental retardation. Pt resides in [facility] group home and states he was upset because group home staff accused him of smoking.  He says they are mean to him and "stick their finger in my face, I don't like staff". He says he has arguments with staff member every day. Pt told this Probation officer he is SI and is hearing voices, telling him to hit himself. Pt was found by police, running from group home after being called by caregiver.  The police report that they were able to convince him to return to the group home but pt ran away again and when he was found again he was hitting himself in the head with closed fists.  He then began to bang his head, violently against the police vehicle.  When pt stopped banging his head, he found a jagged rock and cut his wrists.    Axis I: Mood Disorder NOS Axis II: Mental retardation, severity unknown Axis III:  Past Medical History  Diagnosis Date  . Seizures   . Conduct disorder   . Impulse control disorder   . Antisocial personality disorder   . Communication disability   . Moderate mental retardation   . Mental retardation    Axis IV: other psychosocial or environmental problems, problems related to social environment and problems with primary support group Axis V: 31-40 impairment in reality testing  Past Medical History:  Past Medical History  Diagnosis Date  . Seizures   . Conduct disorder   . Impulse control disorder   . Antisocial personality disorder   . Communication disability   . Moderate mental retardation   . Mental retardation     History reviewed. No pertinent past surgical history.  Family History: History reviewed. No pertinent family history.  Social History:  reports that he has been smoking.  He does not have any smokeless tobacco history on file. He reports that he does not drink alcohol or use  illicit drugs.  Additional Social History:  Alcohol / Drug Use Pain Medications: None  Prescriptions: None  Over the Counter: None  History of alcohol / drug use?: No history of alcohol / drug abuse Longest period of sobriety (when/how long): None   CIWA: CIWA-Ar BP: 134/74 mmHg Pulse Rate: 88 COWS:    PATIENT STRENGTHS: (choose at least two) Ability for insight  Allergies: No Known Allergies  Home Medications:  (Not in a hospital admission)  OB/GYN Status:  No LMP for male patient.  General Assessment Data Location of Assessment: [facility] Is this a Tele or Face-to-Face Assessment?: Tele Assessment Is this an Initial Assessment or a Re-assessment for this encounter?: Initial Assessment Living Arrangements: Other (Comment) (Lives in group home--[facility] ) Can pt return to current living arrangement?: Yes Admission Status: Involuntary Is patient capable of signing voluntary admission?: No Transfer from: Group Home Referral Source: MD  Medical Screening Exam ([place]) Medical Exam completed: No Reason for MSE not completed: Other: (None )  [place] Living Arrangements: Other (Comment) (Lives in group home--[facility] ) Name of Psychiatrist: [place]  Name of Therapist: [facility]   Education Status Is patient currently in school?: No Current Grade: None  Highest grade of school patient has completed: None  Name of school: None  Contact person: None   Risk to self with the past 6 months  Suicidal Ideation: Yes-Currently Present Suicidal Intent: Yes-Currently Present Is patient at risk for suicide?: Yes Suicidal Plan?: Yes-Currently Present Specify Current Suicidal Plan: Hit self in the head; he was found cutting himself with jagged rocks  Access to Means: Yes Specify Access to Suicidal Means: [place] What has been your use of drugs/alcohol within the last 12 months?: None  Previous Attempts/Gestures: No How many times?: 0 Other Self Harm  Risks: None  Triggers for Past Attempts: Unknown Intentional Self Injurious Behavior: None Family Suicide History: No Recent stressful life event(s): Conflict (Comment) (Issues with group home staff ) Persecutory voices/beliefs?: No Depression: Yes Depression Symptoms: Feeling angry/irritable Substance abuse history and/or treatment for substance abuse?: No Suicide prevention information given to non-admitted patients: Not applicable  Risk to Others within the past 6 months Homicidal Ideation: No Thoughts of Harm to Others: No Current Homicidal Intent: No Current Homicidal Plan: No Access to Homicidal Means: No Identified Victim: None  History of harm to others?: No Assessment of Violence: None Noted Violent Behavior Description: None  Does patient have access to weapons?: No Criminal Charges Pending?: No Does patient have a court date: No  Psychosis Hallucinations: None noted Delusions: None noted  Mental Status Report Appear/Hygiene: In scrubs Eye Contact: Fair Motor Activity: Unremarkable Speech: Slurred;Soft Level of Consciousness: Alert Mood: Depressed Affect: Depressed;Appropriate to circumstance Anxiety Level: None Thought Processes: Relevant Judgement: Impaired Orientation: Person;Place;Time;Situation Obsessive Compulsive Thoughts/Behaviors: None  Cognitive Functioning Concentration: Normal Memory: Recent Intact;Remote Intact IQ: Average Insight: Poor Impulse Control: Poor Appetite: Good Weight Loss: 0 Weight Gain: 0 Sleep: No Change Total Hours of Sleep: 6 Vegetative Symptoms: None  ADLScreening [facility] Assessment Services) Patient's cognitive ability adequate to safely complete daily activities?: Yes Patient able to express need for assistance with ADLs?: Yes Independently performs ADLs?: Yes (appropriate for developmental age)  Prior Inpatient Therapy Prior Inpatient Therapy: Yes Prior Therapy Dates: Unk  Prior Therapy Facilty/Provider(s):  [facility]  Reason for Treatment: Mental health   Prior Outpatient Therapy Prior Outpatient Therapy: No Prior Therapy Dates: Unk  Prior Therapy Facilty/Provider(s): [facility]  Reason for Treatment: [facility]   ADL Screening (condition at time of admission) Patient's cognitive ability adequate to safely complete daily activities?: Yes Is the patient deaf or have difficulty hearing?: No Does the patient have difficulty seeing, even when wearing glasses/contacts?: No Does the patient have difficulty concentrating, remembering, or making decisions?: No Patient able to express need for assistance with ADLs?: Yes Does the patient have difficulty dressing or bathing?: No Independently performs ADLs?: Yes (appropriate for developmental age) Does the patient have difficulty walking or climbing stairs?: No Weakness of Legs: None Weakness of Arms/Hands: None  Home Assistive Devices/Equipment Home Assistive Devices/Equipment: None  Therapy Consults (therapy consults require a physician order) PT Evaluation Needed: No OT Evalulation Needed: No SLP Evaluation Needed: No Abuse/Neglect Assessment (Assessment to be complete while patient is alone) Physical Abuse: Denies Verbal Abuse: Denies Sexual Abuse: Denies Exploitation of patient/patient's resources: Denies Self-Neglect: Denies Values / Beliefs Cultural Requests During Hospitalization: None Spiritual Requests During Hospitalization: None Consults Spiritual Care Consult Needed: No Social Work Consult Needed: No Regulatory affairs officer (For Healthcare) Does patient have an advance directive?: No Would patient like information on creating an advanced directive?: No - patient declined information Nutrition Screen- [facility] Patient's home diet: Regular  Additional Information 1:1 In Past 12 Months?: No CIRT Risk: No Elopement Risk: No Does patient have medical clearance?: Yes     Disposition:  Disposition Initial Assessment Completed for this  Encounter: Yes Disposition of Patient: Referred to (AM psych eval for final diposition ) Patient referred to: Other (Comment) (AM psych eval for final disposition )  Girtha Rm 02/08/2014 8:00 AM

## 2014-02-13 NOTE — Consult Note (Signed)
Face to face evaluation and I agree with this note 

## 2017-05-14 ENCOUNTER — Other Ambulatory Visit: Payer: Self-pay | Admitting: Nephrology

## 2017-05-14 DIAGNOSIS — N183 Chronic kidney disease, stage 3 unspecified: Secondary | ICD-10-CM

## 2017-06-03 ENCOUNTER — Other Ambulatory Visit: Payer: Self-pay

## 2017-06-07 ENCOUNTER — Ambulatory Visit
Admission: RE | Admit: 2017-06-07 | Discharge: 2017-06-07 | Disposition: A | Discharge status: Home / Self Care | Payer: Medicare Other | Source: Ambulatory Visit | Attending: Nephrology | Admitting: Nephrology

## 2017-06-07 DIAGNOSIS — N183 Chronic kidney disease, stage 3 unspecified: Secondary | ICD-10-CM

## 2017-08-23 ENCOUNTER — Encounter: Payer: Self-pay | Admitting: Physician Assistant

## 2017-09-07 ENCOUNTER — Ambulatory Visit: Payer: Self-pay | Admitting: Physician Assistant

## 2017-09-28 ENCOUNTER — Ambulatory Visit: Payer: Self-pay | Admitting: Physician Assistant

## 2017-10-20 ENCOUNTER — Ambulatory Visit: Payer: Self-pay | Admitting: Physician Assistant

## 2017-11-11 ENCOUNTER — Ambulatory Visit: Payer: Self-pay | Admitting: Physician Assistant

## 2017-11-15 ENCOUNTER — Ambulatory Visit: Payer: Self-pay | Admitting: Physician Assistant

## 2017-11-24 ENCOUNTER — Encounter (HOSPITAL_COMMUNITY): Payer: Self-pay | Admitting: Emergency Medicine

## 2017-11-24 ENCOUNTER — Other Ambulatory Visit: Payer: Self-pay

## 2017-11-24 ENCOUNTER — Emergency Department (HOSPITAL_COMMUNITY)
Admission: EM | Admit: 2017-11-24 | Discharge: 2017-11-25 | Disposition: A | Discharge status: Home / Self Care | Payer: Medicare Other | Attending: Emergency Medicine | Admitting: Emergency Medicine

## 2017-11-24 DIAGNOSIS — E1122 Type 2 diabetes mellitus with diabetic chronic kidney disease: Secondary | ICD-10-CM | POA: Insufficient documentation

## 2017-11-24 DIAGNOSIS — N183 Chronic kidney disease, stage 3 (moderate): Secondary | ICD-10-CM | POA: Insufficient documentation

## 2017-11-24 DIAGNOSIS — J45909 Unspecified asthma, uncomplicated: Secondary | ICD-10-CM | POA: Insufficient documentation

## 2017-11-24 DIAGNOSIS — M25561 Pain in right knee: Secondary | ICD-10-CM

## 2017-11-24 DIAGNOSIS — Z79899 Other long term (current) drug therapy: Secondary | ICD-10-CM | POA: Insufficient documentation

## 2017-11-24 DIAGNOSIS — I129 Hypertensive chronic kidney disease with stage 1 through stage 4 chronic kidney disease, or unspecified chronic kidney disease: Secondary | ICD-10-CM | POA: Diagnosis not present

## 2017-11-24 DIAGNOSIS — F70 Mild intellectual disabilities: Secondary | ICD-10-CM | POA: Diagnosis not present

## 2017-11-24 DIAGNOSIS — Z7984 Long term (current) use of oral hypoglycemic drugs: Secondary | ICD-10-CM | POA: Diagnosis not present

## 2017-11-24 DIAGNOSIS — F919 Conduct disorder, unspecified: Secondary | ICD-10-CM | POA: Diagnosis not present

## 2017-11-24 DIAGNOSIS — F17200 Nicotine dependence, unspecified, uncomplicated: Secondary | ICD-10-CM | POA: Diagnosis not present

## 2017-11-24 NOTE — ED Triage Notes (Signed)
Patient was walking away from the group home when he sat down on the curb. Upon getting back up, him and [employer] heard a pop from his right knee. Medial swelling. Unable to bear weight. Hx MR, HTN, diabetes. Mother is his legal guardian [phone]

## 2017-11-24 NOTE — ED Notes (Signed)
Bed: WTR5 Expected date:  Expected time:  Means of arrival:  Comments: 

## 2017-11-25 ENCOUNTER — Emergency Department (HOSPITAL_COMMUNITY): Payer: Medicare Other

## 2017-11-25 DIAGNOSIS — M25561 Pain in right knee: Secondary | ICD-10-CM | POA: Diagnosis not present

## 2017-11-25 MED ORDER — IBUPROFEN 200 MG PO TABS: 600.0000 mg | ORAL_TABLET | Freq: Once | ORAL | Status: DC | PRN

## 2017-11-25 MED ORDER — IBUPROFEN 800 MG PO TABS
800.0000 mg | ORAL_TABLET | Freq: Once | ORAL | Status: AC
  Administered 2017-11-25: 800 mg via ORAL
  Filled 2017-11-25: qty 1

## 2017-11-25 NOTE — Discharge Instructions (Addendum)
Your x-ray today did not show a broken bone.  Take 600 mg ibuprofen every 6 hours for pain control.  Ice your knee 3-4 times per day to limit swelling and pain.  Wear a knee sleeve for stability and use crutches as needed to prevent from putting weight on your right leg for comfort.  Follow-up with a sports medicine doctor for further evaluation of your knee pain if symptoms persist.

## 2017-11-25 NOTE — ED Provider Notes (Signed)
[place] DEPT Provider Note   CSN: [id number] Arrival date & time: 11/24/17  2342     History   Chief Complaint Chief Complaint  Patient presents with  . Knee Pain    HPI Mark Escobar is a 52 y.o. male.   52 year old male presents to the emergency department for evaluation of right knee pain.  He was reportedly walking away from the group home when he sat down on the curb.  Upon getting back up, patient had pain to his right knee after hearing a "pop".  He reports tenderness along the lateral aspect of the right knee.  He has not taken any medications prior to arrival.  Weightbearing has been limited secondary to discomfort.  No history of prior right knee pain or injury.     Past Medical History:  Diagnosis Date  . Antisocial personality disorder ([place])   . CKD (chronic kidney disease), stage III ([place])   . Communication disability   . Conduct disorder   . DM w/o complication type II ([place])   . Hypertension   . Impulse control disorder   . Mental retardation   . Mild intermittent asthma   . Moderate mental retardation   . Seizures [facility])     Patient Active Problem List   Diagnosis Date Noted  . Adjustment disorder with disturbance of conduct 02/08/2014    History reviewed. No pertinent surgical history.      Home Medications    Prior to Admission medications   Medication Sig Start Date End Date Taking? Authorizing Provider  lisinopril (PRINIVIL,ZESTRIL) 5 MG tablet Take 5 mg by mouth daily.    [provider]  lovastatin (MEVACOR) 20 MG tablet Take 20 mg by mouth at bedtime.    [provider]  metFORMIN (GLUCOPHAGE) 1000 MG tablet Take 1,000 mg by mouth 2 (two) times daily with a meal.    [provider]  terazosin (HYTRIN) 1 MG capsule Take 1 mg by mouth at bedtime.    [provider]    Family History No family history on file.  Social History Social History   Tobacco Use  .  Smoking status: Light Tobacco Smoker  . Smokeless tobacco: Never Used  Substance Use Topics  . Alcohol use: No  . Drug use: No     Allergies   Patient has no known allergies.   Review of Systems Review of Systems Ten systems reviewed and are negative for acute change, except as noted in the HPI.    Physical Exam Updated Vital Signs BP 130/72 (BP Location: Left Arm)   Pulse 77   Temp 99 F (37.2 C) (Oral)   Resp 14   SpO2 94%   Physical Exam  Constitutional: He is oriented to person, place, and time. He appears well-developed and well-nourished. No distress.  Nontoxic appearing and in NAD  HENT:  Head: Normocephalic and atraumatic.  Eyes: Conjunctivae and EOM are normal. No scleral icterus.  Neck: Normal range of motion.  Cardiovascular: Normal rate, regular rhythm and intact distal pulses.  Pulmonary/Chest: Effort normal. No respiratory distress.  Respirations even and unlabored  Musculoskeletal: Normal range of motion. He exhibits tenderness.  Normal active and passive ROM of the R knee. No deformity or crepitus. TTP noted along the lateral aspect of the knee; not limited to the joint line. No posterior knee TTP.   Neurological: He is alert and oriented to person, place, and time. He exhibits normal muscle tone. Coordination normal.  Able to weightbear, but limited 2/2 pain.  Skin: Skin is warm and dry. No rash noted. He is not diaphoretic. No erythema. No pallor.  Psychiatric: He has a normal mood and affect. His behavior is normal.  Nursing note and vitals reviewed.    ED Treatments / Results  Labs (all labs ordered are listed, but only abnormal results are displayed) Labs Reviewed - No data to display  EKG None  Radiology Dg Knee Complete 4 Views Right  Result Date: 11/25/2017 CLINICAL DATA:  52 y/o  M; swelling of the knee after a pop sound. EXAM: RIGHT KNEE - COMPLETE 4+ VIEW COMPARISON:  None. FINDINGS: No acute fracture or dislocation identified. Mild  tricompartmental osteoarthrosis of the knee with small periarticular osteophytes. Joint spaces are maintained. Vascular calcifications noted. No joint effusion. IMPRESSION: No acute fracture or dislocation identified. Electronically Signed   By: Kristine Garbe M.D.   On: 11/25/2017 02:05    Procedures Procedures (including critical care time)  Medications Ordered in ED Medications  ibuprofen (ADVIL,MOTRIN) tablet 800 mg (has no administration in time range)     Initial Impression / Assessment and Plan / ED Course  I have reviewed the triage vital signs and the nursing notes.  Pertinent labs & imaging results that were available during my care of the patient were reviewed by me and considered in my medical decision making (see chart for details).     Patient presents to the emergency department for evaluation of R knee pain. Patient neurovascularly intact on exam. Full AROM and PROM on exam. Imaging negative for fracture, dislocation, bony deformity. Plan for supportive management including RICE and NSAIDs; primary care follow up as needed. Return precautions discussed and provided. Patient discharged in stable condition with no unaddressed concerns.   Final Clinical Impressions(s) / ED Diagnoses   Final diagnoses:  Acute pain of right knee    ED Discharge Orders    None       Antonietta Breach, PA-C 11/25/17 0515    Palumbo, April, MD 11/25/17 [phone]

## 2017-11-25 NOTE — ED Notes (Signed)
[address]--[phone]  Call for transportation when patient is discharged

## 2017-12-27 ENCOUNTER — Encounter (HOSPITAL_COMMUNITY): Payer: Self-pay | Admitting: Emergency Medicine

## 2017-12-27 ENCOUNTER — Other Ambulatory Visit: Payer: Self-pay

## 2017-12-27 ENCOUNTER — Emergency Department (HOSPITAL_COMMUNITY)
Admission: EM | Admit: 2017-12-27 | Discharge: 2017-12-27 | Disposition: A | Discharge status: Home / Self Care | Payer: Medicare Other | Attending: Emergency Medicine | Admitting: Emergency Medicine

## 2017-12-27 DIAGNOSIS — E1122 Type 2 diabetes mellitus with diabetic chronic kidney disease: Secondary | ICD-10-CM | POA: Insufficient documentation

## 2017-12-27 DIAGNOSIS — R251 Tremor, unspecified: Secondary | ICD-10-CM | POA: Insufficient documentation

## 2017-12-27 DIAGNOSIS — N183 Chronic kidney disease, stage 3 (moderate): Secondary | ICD-10-CM | POA: Diagnosis not present

## 2017-12-27 DIAGNOSIS — J4520 Mild intermittent asthma, uncomplicated: Secondary | ICD-10-CM | POA: Insufficient documentation

## 2017-12-27 DIAGNOSIS — Z7984 Long term (current) use of oral hypoglycemic drugs: Secondary | ICD-10-CM | POA: Diagnosis not present

## 2017-12-27 DIAGNOSIS — F17200 Nicotine dependence, unspecified, uncomplicated: Secondary | ICD-10-CM | POA: Insufficient documentation

## 2017-12-27 DIAGNOSIS — I129 Hypertensive chronic kidney disease with stage 1 through stage 4 chronic kidney disease, or unspecified chronic kidney disease: Secondary | ICD-10-CM | POA: Diagnosis not present

## 2017-12-27 DIAGNOSIS — Z79899 Other long term (current) drug therapy: Secondary | ICD-10-CM | POA: Insufficient documentation

## 2017-12-27 LAB — CBC
HCT: 37.2 % — ABNORMAL LOW (ref 39.0–52.0)
HEMOGLOBIN: 12.4 g/dL — AB (ref 13.0–17.0)
MCH: 29.8 pg (ref 26.0–34.0)
MCHC: 33.3 g/dL (ref 30.0–36.0)
MCV: 89.4 fL (ref 78.0–100.0)
Platelets: 323 10*3/uL (ref 150–400)
RBC: 4.16 MIL/uL — AB (ref 4.22–5.81)
RDW: 13.4 % (ref 11.5–15.5)
WBC: 6.3 10*3/uL (ref 4.0–10.5)

## 2017-12-27 LAB — BASIC METABOLIC PANEL
Anion gap: 7 (ref 5–15)
BUN: 24 mg/dL — ABNORMAL HIGH (ref 6–20)
CHLORIDE: 109 mmol/L (ref 98–111)
CO2: 24 mmol/L (ref 22–32)
CREATININE: 1.54 mg/dL — AB (ref 0.61–1.24)
Calcium: 8.9 mg/dL (ref 8.9–10.3)
GFR calc Af Amer: 59 mL/min — ABNORMAL LOW (ref >60)
GFR calc non Af Amer: 51 mL/min — ABNORMAL LOW (ref >60)
GLUCOSE: 80 mg/dL (ref 70–99)
POTASSIUM: 4.4 mmol/L (ref 3.5–5.1)
SODIUM: 140 mmol/L (ref 135–145)

## 2017-12-27 NOTE — Discharge Instructions (Signed)
Please read and follow all provided instructions.  Your diagnoses today include:  1. Occasional tremors     Tests performed today include:  Blood counts and electrolytes  Vital signs. See below for your results today.   Medications prescribed:   None  Take any prescribed medications only as directed.  Home care instructions:  Follow any educational materials contained in this packet.  BE VERY CAREFUL not to take multiple medicines containing Tylenol (also called acetaminophen). Doing so can lead to an overdose which can damage your liver and cause liver failure and possibly death.   Follow-up instructions: Please follow-up with your primary care provider in the next 7 days for further evaluation of your symptoms.   Return instructions:   Please return to the Emergency Department if you experience worsening symptoms.   Return with recurrent or persistent seizure like symptoms.  Please return if you have any other emergent concerns.  Additional Information:  Your vital signs today were: BP 101/65    Pulse 67    Temp 98.6 F (37 C) (Oral)    Resp 16    Ht 5\' 8"  (1.727 m)    Wt 81.6 kg (180 lb)    SpO2 96%    BMI 27.37 kg/m  If your blood pressure (BP) was elevated above 135/85 this visit, please have this repeated by your doctor within one month. --------------

## 2017-12-27 NOTE — ED Provider Notes (Signed)
[facility] Provider Note   CSN: [id number] Arrival date & time: 12/27/17  0010     History   Chief Complaint Chief Complaint  Patient presents with  . Tremors    HPI Mark Escobar is a 52 y.o. male.  Patient with history of diabetes, chronic kidney disease, history of seizures per chart however patient denies, not currently on any antiepileptics --presents with complaint of right arm shaking.  Caregiver states that patient was outside smoking and when he came in he began having involuntary shaking of his right arm that lasted 20 minutes.  This was not generalized and he did not lose consciousness.  Symptoms gradually resolved.  Patient has not reportedly had symptoms like this in the past.  No head injury or headache.  No nausea, vomiting, confusion.  Patient is currently back to his baseline.  No fevers, diarrhea, urinary symptoms. Patient denies signs of stroke including: facial droop, slurred speech, aphasia, weakness/numbness in extremities, imbalance/trouble walking.      Past Medical History:  Diagnosis Date  . Antisocial personality disorder ([place])   . CKD (chronic kidney disease), stage III ([place])   . Communication disability   . Conduct disorder   . DM w/o complication type II ([place])   . Hypertension   . Impulse control disorder   . Mental retardation   . Mild intermittent asthma   . Moderate mental retardation   . Seizures [facility])     Patient Active Problem List   Diagnosis Date Noted  . Adjustment disorder with disturbance of conduct 02/08/2014    History reviewed. No pertinent surgical history.      Home Medications    Prior to Admission medications   Medication Sig Start Date End Date Taking? Authorizing Provider  acetaminophen (TYLENOL) 500 MG tablet Take 500 mg by mouth every 6 (six) hours as needed.   Yes [provider]  bismuth subsalicylate (PEPTO BISMOL) 262 MG/15ML suspension Take 30 mLs by mouth  every 6 (six) hours as needed for indigestion.   Yes [provider]  diphenhydrAMINE (BENADRYL) 25 MG tablet Take 25 mg by mouth every 6 (six) hours as needed for allergies.    Yes [provider]  guaiFENesin-dextromethorphan (ROBITUSSIN DM) 100-10 MG/5ML syrup Take 5 mLs by mouth every 4 (four) hours as needed for cough.   Yes [provider]  lisinopril (PRINIVIL,ZESTRIL) 5 MG tablet Take 5 mg by mouth daily.   Yes [provider]  lovastatin (MEVACOR) 20 MG tablet Take 20 mg by mouth at bedtime.   Yes [provider]  metFORMIN (GLUCOPHAGE) 1000 MG tablet Take 1,000 mg by mouth 2 (two) times daily with a meal.   Yes [provider]  terazosin (HYTRIN) 1 MG capsule Take 1 mg by mouth at bedtime.   Yes [provider]    Family History History reviewed. No pertinent family history.  Social History Social History   Tobacco Use  . Smoking status: Light Tobacco Smoker  . Smokeless tobacco: Never Used  Substance Use Topics  . Alcohol use: No  . Drug use: No     Allergies   Patient has no known allergies.   Review of Systems Review of Systems  Constitutional: Negative for fever.  HENT: Negative for rhinorrhea and sore throat.   Eyes: Negative for redness.  Respiratory: Negative for cough.   Cardiovascular: Negative for chest pain.  Gastrointestinal: Negative for abdominal pain, diarrhea, nausea and vomiting.  Genitourinary: Negative for  dysuria.  Musculoskeletal: Negative for myalgias.  Skin: Negative for rash.  Neurological: Positive for tremors. Negative for dizziness, syncope, facial asymmetry, speech difficulty, weakness, light-headedness, numbness and headaches.     Physical Exam Updated Vital Signs BP 116/61 (BP Location: Left Arm)   Pulse 78   Temp 98.6 F (37 C) (Oral)   Resp 16   Ht 5\' 8"  (1.727 m)   Wt 81.6 kg (180 lb)   SpO2 92%   BMI 27.37 kg/m   Physical Exam  Constitutional: He is  oriented to person, place, and time. He appears well-developed and well-nourished.  HENT:  Head: Normocephalic and atraumatic.  Right Ear: Tympanic membrane, external ear and ear canal normal.  Left Ear: Tympanic membrane, external ear and ear canal normal.  Nose: Nose normal.  Mouth/Throat: Uvula is midline, oropharynx is clear and moist and mucous membranes are normal.  Eyes: Pupils are equal, round, and reactive to light. Conjunctivae, EOM and lids are normal.  Neck: Normal range of motion. Neck supple.  Cardiovascular: Normal rate and regular rhythm.  Pulmonary/Chest: Effort normal and breath sounds normal.  Abdominal: Soft. There is no tenderness.  Musculoskeletal: Normal range of motion.       Cervical back: He exhibits normal range of motion, no tenderness and no bony tenderness.  Neurological: He is alert and oriented to person, place, and time. He has normal strength and normal reflexes. No cranial nerve deficit or sensory deficit. He exhibits normal muscle tone. He displays a negative Romberg sign. Coordination and gait normal. GCS eye subscore is 4. GCS verbal subscore is 5. GCS motor subscore is 6.  Skin: Skin is warm and dry.  Psychiatric: He has a normal mood and affect.  Nursing note and vitals reviewed.    ED Treatments / Results  Labs (all labs ordered are listed, but only abnormal results are displayed) Labs Reviewed - No data to display  EKG None  Radiology No results found.  Procedures Procedures (including critical care time)  Medications Ordered in ED Medications - No data to display   Initial Impression / Assessment and Plan / ED Course  I have reviewed the triage vital signs and the nursing notes.  Pertinent labs & imaging results that were available during my care of the patient were reviewed by me and considered in my medical decision making (see chart for details).     Patient seen and examined.  Patient had about a 5-hour wait prior to my  exam.  He has not re-developed any symptoms.  Will check blood sugar and basic lab work.  If patient remains at baseline and labs are reassuring, anticipate discharged home with PCP follow-up.  Questionable partial seizure.  I asked both caregiver bedside and caregiver of her telephone about the seizure history and they are not aware of any.  Per records, seizure history dates back to at least 2011.  I do not see that he was on any antiepileptics at that time.  Vital signs reviewed and are as follows: BP 116/61 (BP Location: Left Arm)   Pulse 78   Temp 98.6 F (37 C) (Oral)   Resp 16   Ht 5\' 8"  (1.727 m)   Wt 81.6 kg (180 lb)   SpO2 92%   BMI 27.37 kg/m   8:11 AM patient remains asymptomatic.  Lab work is reassuring.  We will discharged home at this time.  Encouraged return to the emergency department with recurrent symptoms and PCP follow-up.  Final Clinical Impressions(s) /  ED Diagnoses   Final diagnoses:  Occasional tremors   Patient with right arm shaking, involuntary by history, earlier tonight.  Patient has been monitored in the emergency department without return of symptoms.  Lab work is reassuring.  He has no focal neurological deficits on exam or headache or head trauma.  Do not feel that head imaging is indicated at this time.  Questionable seizure history however patient is not on any antiepileptics.  I would not start patient on antiepileptics at this time given isolated occurrence.  Feel that he would benefit from PCP follow-up for further evaluation.  ED Discharge Orders    None       [place]isle Cater, Hershal Coria 12/27/17 0813    [place], Jenny Reichmann, MD 12/27/17 [id number]

## 2017-12-27 NOTE — ED Triage Notes (Signed)
Pt presents via [facility] for evaluation of right arm tremors. Pt denies any other symptoms and is alert and oriented at this time.

## 2018-01-11 ENCOUNTER — Ambulatory Visit (INDEPENDENT_AMBULATORY_CARE_PROVIDER_SITE_OTHER): Payer: Medicare Other | Admitting: Gastroenterology

## 2018-01-11 ENCOUNTER — Encounter: Payer: Self-pay | Admitting: Gastroenterology

## 2018-01-11 VITALS — BP 120/74 | HR 76 | Ht 68.0 in | Wt 160.0 lb

## 2018-01-11 DIAGNOSIS — Z1211 Encounter for screening for malignant neoplasm of colon: Secondary | ICD-10-CM | POA: Diagnosis not present

## 2018-01-11 NOTE — Progress Notes (Signed)
[facility] Consult Note:  History: Mark Escobar 01/11/2018  Referring physician: Bernerd Limbo, MD   Reason for consult/chief complaint: Colon Cancer Screening (Age, patient has no compliants)  Subjective  HPI:  This is a 52 year old man referred by primary care to consider screening colonoscopy.  The patient has cognitive impairment, limiting his history.  He was accompanied by a staff member of a facility that provides him services at home during the day.  As near as I can tell, he lives in his own apartment with these services.  His mother is listed as his guardian, but was not at today's appointment.  Valente denies abdominal pain, trouble with bowel habits or rectal bleeding.  His appetite is good and he denies trouble swallowing.  He did not recognize the name of his primary care physician.  He thought that the date was sometime in the 26s.  He does know the his address and date of birth and the current president of [place].  No-show for office visit on 09/07/2017, rescheduled visits 4/9, 5/22, 6/19 See 05/20/17 PCP note when patient noted incorrect date and president name. ROS:  Review of Systems  Constitutional: Negative for appetite change and unexpected weight change.  HENT: Negative for mouth sores and voice change.   Eyes: Negative for pain and redness.  Respiratory: Negative for cough and shortness of breath.   Cardiovascular: Negative for chest pain and palpitations.  Genitourinary: Negative for dysuria and hematuria.  Musculoskeletal: Negative for arthralgias and myalgias.  Skin: Negative for pallor and rash.  Neurological: Negative for weakness and headaches.  Hematological: Negative for adenopathy.   Form was filled out by his staff member.  Past Medical History: Past Medical History:  Diagnosis Date  . Antisocial personality disorder ([place])   . CKD (chronic kidney disease), stage III ([place])   . Communication disability   . Conduct  disorder   . 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 Hypertension   . Impulse control disorder   . Mental retardation   . Mild intermittent asthma   . Moderate mental retardation   . Seizures ([place])      Past Surgical History: No past surgical history on file. No prior surgery  Family History: No family history on file. No known family history of colorectal cancer Social History: Social History   Socioeconomic History  . Marital status: Single    Spouse name: Not on file  . Number of children: Not on file  . Years of education: Not on file  . Highest education level: Not on file  Occupational History  . Not on file  Social Needs  . Financial resource strain: Not on file  . Food insecurity:    Worry: Not on file    Inability: Not on file  . Transportation needs:    Medical: Not on file    Non-medical: Not on file  Tobacco Use  . Smoking status: Light Tobacco Smoker  . Smokeless tobacco: Never Used  Substance and Sexual Activity  . Alcohol use: No  . Drug use: No  . Sexual activity: Never  Lifestyle  . Physical activity:    Days per week: Not on file    Minutes per session: Not on file  . Stress: Not on file  Relationships  . Social connections:    Talks on phone: Not on file    Gets together: Not on file    Attends religious service: Not on file  Active member of club or organization: Not on file    Attends meetings of clubs or organizations: Not on file    Relationship status: Not on file  Other Topics Concern  . Not on file  Social History Narrative  . Not on file    Allergies: No Known Allergies  Outpatient Meds: Current Outpatient Medications  Medication Sig Dispense Refill  . acetaminophen (TYLENOL) 500 MG tablet Take 500 mg by mouth every 6 (six) hours as needed.    Marland Kitchen lisinopril (PRINIVIL,ZESTRIL) 5 MG tablet Take 5 mg by mouth daily.    Marland Kitchen lovastatin (MEVACOR) 20 MG tablet Take 20 mg by mouth at bedtime.    Marland Kitchen terazosin (HYTRIN) 1 MG capsule  Take 1 mg by mouth at bedtime.     No current facility-administered medications for this visit.       ___________________________________________________________________ Objective   Exam:  BP 120/74   Pulse 76   Ht 5\' 8"  (1.727 m)   Wt 160 lb (72.6 kg)   BMI 24.33 kg/m    General: this is a(n) well-appearing man, pleasant and conversational.  Eyes: sclera anicteric, no redness  ENT: oral mucosa moist without lesions, no cervical or supraclavicular lymphadenopathy, edentulous  CV: RRR without murmur, S1/S2, no JVD, no peripheral edema  Resp: clear to auscultation bilaterally, normal RR and effort noted  GI: soft, no tenderness, with active bowel sounds. No guarding or palpable organomegaly noted.  Skin; warm and dry, no rash or jaundice noted   Labs:  No data for review  Assessment: Encounter Diagnosis  Name Primary?  . Special screening for malignant neoplasms, colon Yes    [name] appears appropriate for screening colonoscopy, but there are some logistical issues to be worked out.  I do not feel he is capable of fully understanding the risks and benefits and therefore give informed consent for such a procedure.  We need to contact his mother and see if she is aware that he was referred, and if she is agreeable to him having a colonoscopy.  She would also need to sign consent and the procedure scheduled within 30 days of that consent and/or she be present on the day of his procedure as well.  We would also need to figure out the logistics of his bowel preparation, specifically who would go through the instructions with our staff and also be there to help him do the bowel preparation.  If we are able to work these issues out successfully, we will schedule him for colonoscopy.   Thank you for the courtesy of this consult.  Please call me with any questions or concerns.  Nelida Meuse III  CC: Bernerd Limbo, MD

## 2018-01-11 NOTE — Patient Instructions (Signed)
If you are age 52 or older, your body mass index should be between 23-30. Your Body mass index is 24.33 kg/m. If this is out of the aforementioned range listed, please consider follow up with your Primary Care Provider.  If you are age 42 or younger, your body mass index should be between 19-25. Your Body mass index is 24.33 kg/m. If this is out of the aformentioned range listed, please consider follow up with your Primary Care Provider.     It was a pleasure to see you today!   Dr. Loletha Carrow

## 2018-03-23 ENCOUNTER — Encounter: Payer: Self-pay | Admitting: Gastroenterology

## 2018-03-23 ENCOUNTER — Ambulatory Visit (INDEPENDENT_AMBULATORY_CARE_PROVIDER_SITE_OTHER): Payer: Medicare Other | Admitting: Gastroenterology

## 2018-03-23 VITALS — BP 122/80 | HR 79 | Ht 64.0 in | Wt 160.0 lb

## 2018-03-23 DIAGNOSIS — K625 Hemorrhage of anus and rectum: Secondary | ICD-10-CM | POA: Diagnosis not present

## 2018-03-23 NOTE — Progress Notes (Signed)
[facility] Progress Note  Chief Complaint: Rectal bleeding  Subjective  History:  This is a 52 year old man seen by me on January 11, 2018 at the request of the medical staff from his group home.  At that note, I outlined how I feel his cognitive impairment precludes him from getting informed consent for procedure, and how we would need to put in touch with his guardian in order to somehow obtain informed consent, as well as manage the logistics of him assisted with a bowel preparation at his facility.  This did not occur, and he was referred back to me today after a reported episode of rectal bleeding.  He cannot give me any details about it, and the staff member that accompanies him today did not have any more information about it.  He seems to deny abdominal pain.  ROS: Cardiovascular:  no chest pain Respiratory: no dyspnea  The patient's Past Medical, Family and Social History were reviewed and are on file in the EMR.  Objective:  Med list reviewed  Current Outpatient Medications:  .  acetaminophen (TYLENOL) 500 MG tablet, Take 500 mg by mouth every 6 (six) hours as needed., Disp: , Rfl:  .  bismuth subsalicylate (PEPTO BISMOL) 262 MG/15ML suspension, Take 30 mLs by mouth every 6 (six) hours as needed., Disp: , Rfl:  .  dimenhyDRINATE (DRAMAMINE) 50 MG tablet, Take 50 mg by mouth every 8 (eight) hours as needed., Disp: , Rfl:  .  famotidine (PEPCID) 10 MG tablet, Take 10 mg by mouth 2 (two) times daily., Disp: , Rfl:  .  guaiFENesin (ROBITUSSIN) 100 MG/5ML liquid, Take 200 mg by mouth 3 (three) times daily as needed for cough., Disp: , Rfl:  .  ibuprofen (ADVIL,MOTRIN) 200 MG tablet, Take 200 mg by mouth every 6 (six) hours as needed., Disp: , Rfl:  .  lisinopril (PRINIVIL,ZESTRIL) 5 MG tablet, Take 5 mg by mouth daily., Disp: , Rfl:  .  loperamide (IMODIUM) 2 MG capsule, Take 2 mg by mouth as needed for diarrhea or loose stools., Disp: , Rfl:  .  lovastatin (MEVACOR) 20  MG tablet, Take 20 mg by mouth at bedtime., Disp: , Rfl:  .  magnesium hydroxide (MILK OF MAGNESIA) 400 MG/5ML suspension, Take 5 mLs by mouth daily as needed for mild constipation., Disp: , Rfl:  .  metFORMIN (GLUCOPHAGE) 1000 MG tablet, Take 1,000 mg by mouth 2 (two) times daily with a meal., Disp: , Rfl:  .  Miconazole Nitrate (ALOE VESTA 2-N-1 ANTIFUNGAL EX), Apply topically., Disp: , Rfl:  .  pseudoephedrine (SUDAFED) 30 MG tablet, Take 30 mg by mouth every 4 (four) hours as needed for congestion., Disp: , Rfl:  .  terazosin (HYTRIN) 1 MG capsule, Take 1 mg by mouth at bedtime., Disp: , Rfl:    Vital signs in last 24 hrs: Vitals:   03/23/18 1529  BP: 122/80  Pulse: 79  SpO2: 96%    Physical Exam  Exam chaperoned by Magdalene River, MA  HEENT: sclera anicteric, oral mucosa moist without lesions  Neck: supple, no thyromegaly, JVD or lymphadenopathy  Cardiac: RRR without murmurs, S1S2 heard, no peripheral edema  Pulm: clear to auscultation bilaterally, normal RR and effort noted  Abdomen: soft, no tenderness, with active bowel sounds. No guarding or palpable hepatosplenomegaly.  Skin; warm and dry, no jaundice or rash Rectal:  Normal external and internal  No data for review  @ASSESSMENTPLANBEGIN @ Assessment: Encounter Diagnosis  Name Primary?  Mark Escobar  Rectal bleeding Yes   This patient was originally sent for screening, and now reportedly had an episode of rectal bleeding.  I still feel that a colonoscopy is appropriate for him, however feelings remain the same about the need for his guardian to be present in order to have a discussion about the procedure and therefore give informed consent.  This is a medico-legal necessity. If that is achieved, the procedure can be scheduled and would need to be done within 30 days of it and the consent so that authorization does not expire.  And we would still need to work out the logistics of the bowel preparation communicate and with his group  home Personnel officer.  My recent office note as well as this were printed today and put in a sealed envelope addressed to the group home manager as identified by the staff member that accompanied Mr. Loken to the office today, who will hand deliver it.  My contact information was enclosed.  Total time 20 minutes, over half spent face-to-face with patient in counseling and coordination of care.   Nelida Meuse III

## 2018-03-30 ENCOUNTER — Telehealth: Payer: Self-pay | Admitting: Gastroenterology

## 2018-03-30 NOTE — Telephone Encounter (Signed)
Left message for Chasity re: having mother sign our consent/schedule appointment with pre-visit.

## 2018-03-30 NOTE — Telephone Encounter (Signed)
Please let Chasity know that I am committed to getting this done for Mr. Mark Escobar, there are just legal requirements regarding informed consent.  I do not need to speak with the patient's mother.  However, she needs a nurse visit at our office to review procedure risks/benefits and sign our consent form.  She does not need to be present the day of his procedure, but his procedure would need to be done within 30 days of consent signature.  All instructions would need to be conveyed to his group home staff.

## 2018-03-30 NOTE — Telephone Encounter (Signed)
Spoke to [employer] at patient's group home, patient's mother is his guardian. Would you like to speak to his mother re: colonoscopy? If we do schedule procedure, are you requiring her to be here that day? She has signed a paper giving his group home staff permission to be in attendance/guardian for appointments.

## 2018-03-31 NOTE — Telephone Encounter (Signed)
Pt mother called to schd appt her phone # is [phone]

## 2018-03-31 NOTE — Telephone Encounter (Signed)
Left message for patient's mother to call back so we can schedule colonoscopy and pre-visit.

## 2018-03-31 NOTE — Telephone Encounter (Signed)
Spoke to [place], she will contact patient's mother to see if we can get her in for a pre-visit with the nurse. She will call back and let me know.

## 2018-04-04 ENCOUNTER — Telehealth: Payer: Self-pay | Admitting: Gastroenterology

## 2018-04-04 NOTE — Telephone Encounter (Signed)
See other phone note for additional details.

## 2018-04-04 NOTE — Telephone Encounter (Signed)
Patient has been scheduled for a colonoscopy on 04/26/18 2:30.  I spoke with Chasity at the group home and she is aware that patient will need to arrive at 1:30 and will need someone from the home here for the entire time he is here.  She verbalized understanding.  She will fax an updated medication list.  Mom will come on 04/12/18 2:30 to sign consent and get instructions for the home.

## 2018-04-12 ENCOUNTER — Other Ambulatory Visit: Payer: Self-pay

## 2018-04-12 ENCOUNTER — Ambulatory Visit (AMBULATORY_SURGERY_CENTER): Payer: Self-pay

## 2018-04-12 DIAGNOSIS — Z1211 Encounter for screening for malignant neoplasm of colon: Secondary | ICD-10-CM

## 2018-04-12 DIAGNOSIS — K625 Hemorrhage of anus and rectum: Secondary | ICD-10-CM

## 2018-04-12 MED ORDER — NA SULFATE-K SULFATE-MG SULF 17.5-3.13-1.6 GM/177ML PO SOLN: 1.0000 | Freq: Once | ORAL | 0 refills | Status: AC

## 2018-04-12 NOTE — Progress Notes (Signed)
No egg or soy allergy known to patient  Pts mother is the POA , pt is mentally retarded and stays in a group home. Pts mother is unaware of medications pt is taking. Mother will take instructions to group home today and have staff to call us back for more information about pts health and medications. Mother will bring in [place] papers the day of procedure.  Mother is taking prep instructions to group home and is to have group home to call us with updated medications.

## 2018-04-26 ENCOUNTER — Ambulatory Visit (AMBULATORY_SURGERY_CENTER): Payer: Medicare Other | Admitting: Gastroenterology

## 2018-04-26 ENCOUNTER — Telehealth: Payer: Self-pay | Admitting: Gastroenterology

## 2018-04-26 ENCOUNTER — Encounter: Payer: Self-pay | Admitting: Gastroenterology

## 2018-04-26 VITALS — BP 117/68 | HR 74 | Temp 99.1°F | Resp 15 | Ht 64.0 in | Wt 160.0 lb

## 2018-04-26 DIAGNOSIS — D120 Benign neoplasm of cecum: Secondary | ICD-10-CM

## 2018-04-26 DIAGNOSIS — Z1211 Encounter for screening for malignant neoplasm of colon: Secondary | ICD-10-CM | POA: Diagnosis not present

## 2018-04-26 MED ORDER — SODIUM CHLORIDE 0.9 % IV SOLN: 500.0000 mL | Freq: Once | INTRAVENOUS | Status: DC

## 2018-04-26 NOTE — Progress Notes (Signed)
Called to room to assist during endoscopic procedure.  Patient ID and intended procedure confirmed with present staff. Received instructions for my participation in the procedure from the performing physician.  

## 2018-04-26 NOTE — Telephone Encounter (Signed)
I spoke with Chastity regarding the patient's medications.  He may take his usual meds this am except his metformin.  Chastity will be accompanying him to his procedure today.

## 2018-04-26 NOTE — Progress Notes (Signed)
A and O x3. Report to RN. Tolerated MAC anesthesia well.

## 2018-04-26 NOTE — Op Note (Signed)
[place] Patient Name: Mark Escobar Procedure Date: 04/26/2018 2:46 PM MRN: [id number] Endoscopist: Mallie Mussel L. Loletha Carrow , MD Age: 52 Referring MD:  Date of Birth: 12-23-1965 Gender: Male Account #: [id number] Procedure:                Colonoscopy Indications:              Screening for colorectal malignant neoplasm, This                            is the patient's first colonoscopy Medicines:                Monitored Anesthesia Care Procedure:                Pre-Anesthesia Assessment:                           - Prior to the procedure, a History and Physical                            was performed, and patient medications and                            allergies were reviewed. The patient's tolerance of                            previous anesthesia was also reviewed. The risks                            and benefits of the procedure and the sedation                            options and risks were discussed with the patient.                            All questions were answered, and informed consent                            was obtained. Prior Anticoagulants: The patient has                            taken no previous anticoagulant or antiplatelet                            agents. ASA Grade Assessment: II - A patient with                            mild systemic disease. After reviewing the risks                            and benefits, the patient was deemed in                            satisfactory condition to undergo the procedure.  After obtaining informed consent, the colonoscope                            was passed under direct vision. Throughout the                            procedure, the patient's blood pressure, pulse, and                            oxygen saturations were monitored continuously. The                            Colonoscope was introduced through the anus and                            advanced to the the cecum,  identified by                            appendiceal orifice and ileocecal valve. The                            colonoscopy was performed without difficulty. The                            patient tolerated the procedure well. The quality                            of the bowel preparation was good. The ileocecal                            valve, appendiceal orifice, and rectum were                            photographed. The bowel preparation used was SUPREP. Scope In: 2:57:13 PM Scope Out: 3:14:00 PM Scope Withdrawal Time: 0 hours 14 minutes 7 seconds  Total Procedure Duration: 0 hours 16 minutes 47 seconds  Findings:                 The perianal and digital rectal examinations were                            normal.                           A 3 mm polyp was found in the cecum. The polyp was                            sessile. The polyp was removed with a cold snare.                            Resection and retrieval were complete.                           A 1-2 mm polyp was found in the cecum. The  polyp                            was flat. The polyp was removed with a cold biopsy                            forceps. Resection and retrieval were complete.                           The exam was otherwise without abnormality on                            direct and retroflexion views. Complications:            No immediate complications. Estimated Blood Loss:     Estimated blood loss: none. Impression:               - One 3 mm polyp in the cecum, removed with a cold                            snare. Resected and retrieved.                           - One 1-2 mm polyp in the cecum, removed with a                            cold biopsy forceps. Resected and retrieved.                           - The examination was otherwise normal on direct                            and retroflexion views. Recommendation:           - Patient has a contact number available for                             emergencies. The signs and symptoms of potential                            delayed complications were discussed with the                            patient. Return to normal activities tomorrow.                            Written discharge instructions were provided to the                            patient.                           - Resume previous diet.                           - Continue present medications.                           -  Await pathology results.                           - Repeat colonoscopy is recommended for                            surveillance. The colonoscopy date will be                            determined after pathology results from today's                            exam become available for review. Henry L. Loletha Carrow, MD 04/26/2018 3:20:44 PM This report has been signed electronically.

## 2018-04-26 NOTE — Patient Instructions (Signed)
Handouts Provided:  Polyps  YOU HAD AN ENDOSCOPIC PROCEDURE TODAY AT THE [facility]:   Refer to the procedure report that was given to you for any specific questions about what was found during the examination.  If the procedure report does not answer your questions, please call your gastroenterologist to clarify.  If you requested that your care partner not be given the details of your procedure findings, then the procedure report has been included in a sealed envelope for you to review at your convenience later.  YOU SHOULD EXPECT: Some feelings of bloating in the abdomen. Passage of more gas than usual.  Walking can help get rid of the air that was put into your GI tract during the procedure and reduce the bloating. If you had a lower endoscopy (such as a colonoscopy or flexible sigmoidoscopy) you may notice spotting of blood in your stool or on the toilet paper. If you underwent a bowel prep for your procedure, you may not have a normal bowel movement for a few days.  Please Note:  You might notice some irritation and congestion in your nose or some drainage.  This is from the oxygen used during your procedure.  There is no need for concern and it should clear up in a day or so.  SYMPTOMS TO REPORT IMMEDIATELY:   Following lower endoscopy (colonoscopy or flexible sigmoidoscopy):  Excessive amounts of blood in the stool  Significant tenderness or worsening of abdominal pains  Swelling of the abdomen that is new, acute  Fever of 100F or higher  For urgent or emergent issues, a gastroenterologist can be reached at any hour by calling [phone].   DIET:  We do recommend a small meal at first, but then you may proceed to your regular diet.  Drink plenty of fluids but you should avoid alcoholic beverages for 24 hours.  ACTIVITY:  You should plan to take it easy for the rest of today and you should NOT DRIVE or use heavy machinery until tomorrow (because of the sedation  medicines used during the test).    FOLLOW UP: Our staff will call the number listed on your records the next business day following your procedure to check on you and address any questions or concerns that you may have regarding the information given to you following your procedure. If we do not reach you, we will leave a message.  However, if you are feeling well and you are not experiencing any problems, there is no need to return our call.  We will assume that you have returned to your regular daily activities without incident.  If any biopsies were taken you will be contacted by phone or by letter within the next 1-3 weeks.  Please call us at [phone] if you have not heard about the biopsies in 3 weeks.    SIGNATURES/CONFIDENTIALITY: You and/or your care partner have signed paperwork which will be entered into your electronic medical record.  These signatures attest to the fact that that the information above on your After Visit Summary has been reviewed and is understood.  Full responsibility of the confidentiality of this discharge information lies with you and/or your care-partner.  

## 2018-04-26 NOTE — Telephone Encounter (Signed)
Pt nurse Chasity call and want to speak with nurse about pt meds.

## 2018-04-27 ENCOUNTER — Telehealth: Payer: Self-pay | Admitting: *Deleted

## 2018-04-27 NOTE — Telephone Encounter (Signed)
First attempt, mailbox full and unable to leave message.

## 2018-04-27 NOTE — Telephone Encounter (Signed)
  Follow up Call-  Call back number 04/26/2018  Post procedure Call Back phone  # [phone]  Permission to leave phone message Yes  Some recent data might be hidden     Patient questions:  Do you have a fever, pain , or abdominal swelling? No. Pain Score  0 *  Have you tolerated food without any problems? Yes.    Have you been able to return to your normal activities? Yes.    Do you have any questions about your discharge instructions: Diet   No. Medications  No. Follow up visit  No.  Do you have questions or concerns about your Care? No.  Actions: * If pain score is 4 or above: No action needed, pain <4.

## 2018-05-05 ENCOUNTER — Encounter: Payer: Self-pay | Admitting: Gastroenterology

## 2019-02-05 ENCOUNTER — Encounter (HOSPITAL_COMMUNITY): Payer: Self-pay

## 2019-02-05 ENCOUNTER — Other Ambulatory Visit: Payer: Self-pay

## 2019-02-05 ENCOUNTER — Emergency Department (HOSPITAL_COMMUNITY)
Admission: EM | Admit: 2019-02-05 | Discharge: 2019-02-06 | Disposition: A | Discharge status: Home / Self Care | Payer: Medicare Other | Attending: Emergency Medicine | Admitting: Emergency Medicine

## 2019-02-05 DIAGNOSIS — N183 Chronic kidney disease, stage 3 (moderate): Secondary | ICD-10-CM | POA: Insufficient documentation

## 2019-02-05 DIAGNOSIS — J45909 Unspecified asthma, uncomplicated: Secondary | ICD-10-CM | POA: Insufficient documentation

## 2019-02-05 DIAGNOSIS — N402 Nodular prostate without lower urinary tract symptoms: Secondary | ICD-10-CM | POA: Diagnosis not present

## 2019-02-05 DIAGNOSIS — Z7984 Long term (current) use of oral hypoglycemic drugs: Secondary | ICD-10-CM | POA: Diagnosis not present

## 2019-02-05 DIAGNOSIS — Z79899 Other long term (current) drug therapy: Secondary | ICD-10-CM | POA: Insufficient documentation

## 2019-02-05 DIAGNOSIS — R1031 Right lower quadrant pain: Secondary | ICD-10-CM | POA: Diagnosis present

## 2019-02-05 DIAGNOSIS — F71 Moderate intellectual disabilities: Secondary | ICD-10-CM | POA: Insufficient documentation

## 2019-02-05 DIAGNOSIS — I129 Hypertensive chronic kidney disease with stage 1 through stage 4 chronic kidney disease, or unspecified chronic kidney disease: Secondary | ICD-10-CM | POA: Diagnosis not present

## 2019-02-05 DIAGNOSIS — E1122 Type 2 diabetes mellitus with diabetic chronic kidney disease: Secondary | ICD-10-CM | POA: Insufficient documentation

## 2019-02-05 DIAGNOSIS — F17200 Nicotine dependence, unspecified, uncomplicated: Secondary | ICD-10-CM | POA: Insufficient documentation

## 2019-02-05 LAB — URINALYSIS, ROUTINE W REFLEX MICROSCOPIC
Bacteria, UA: NONE SEEN
Bilirubin Urine: NEGATIVE
Glucose, UA: NEGATIVE mg/dL
Hgb urine dipstick: NEGATIVE
Ketones, ur: NEGATIVE mg/dL
Nitrite: NEGATIVE
Protein, ur: NEGATIVE mg/dL
Specific Gravity, Urine: 1.019 (ref 1.005–1.030)
pH: 5 (ref 5.0–8.0)

## 2019-02-05 LAB — COMPREHENSIVE METABOLIC PANEL
ALT: 8 U/L (ref 0–44)
AST: 16 U/L (ref 15–41)
Albumin: 4.3 g/dL (ref 3.5–5.0)
Alkaline Phosphatase: 69 U/L (ref 38–126)
Anion gap: 11 (ref 5–15)
BUN: 18 mg/dL (ref 6–20)
CO2: 26 mmol/L (ref 22–32)
Calcium: 9.1 mg/dL (ref 8.9–10.3)
Chloride: 107 mmol/L (ref 98–111)
Creatinine, Ser: 1.32 mg/dL — ABNORMAL HIGH (ref 0.61–1.24)
GFR calc Af Amer: >60 mL/min (ref >60)
GFR calc non Af Amer: >60 mL/min (ref >60)
Glucose, Bld: 75 mg/dL (ref 70–99)
Potassium: 3.7 mmol/L (ref 3.5–5.1)
Sodium: 144 mmol/L (ref 135–145)
Total Bilirubin: 0.5 mg/dL (ref 0.3–1.2)
Total Protein: 7.3 g/dL (ref 6.5–8.1)

## 2019-02-05 LAB — CBC
HCT: 38.5 % — ABNORMAL LOW (ref 39.0–52.0)
Hemoglobin: 12.4 g/dL — ABNORMAL LOW (ref 13.0–17.0)
MCH: 30 pg (ref 26.0–34.0)
MCHC: 32.2 g/dL (ref 30.0–36.0)
MCV: 93.2 fL (ref 80.0–100.0)
Platelets: 308 10*3/uL (ref 150–400)
RBC: 4.13 MIL/uL — ABNORMAL LOW (ref 4.22–5.81)
RDW: 13.4 % (ref 11.5–15.5)
WBC: 5.9 10*3/uL (ref 4.0–10.5)
nRBC: 0 % (ref 0.0–0.2)

## 2019-02-05 LAB — LIPASE, BLOOD: Lipase: 48 U/L (ref 11–51)

## 2019-02-05 MED ORDER — SODIUM CHLORIDE (PF) 0.9 % IJ SOLN
INTRAMUSCULAR | Status: AC
  Filled 2019-02-05: qty 50

## 2019-02-05 MED ORDER — SODIUM CHLORIDE 0.9 % IV BOLUS
1000.0000 mL | Freq: Once | INTRAVENOUS | Status: AC
  Administered 2019-02-05: 1000 mL via INTRAVENOUS

## 2019-02-05 MED ORDER — FENTANYL CITRATE (PF) 100 MCG/2ML IJ SOLN
50.0000 ug | Freq: Once | INTRAMUSCULAR | Status: AC
  Administered 2019-02-05: 50 ug via INTRAVENOUS
  Filled 2019-02-05: qty 2

## 2019-02-05 MED ORDER — ONDANSETRON HCL 4 MG/2ML IJ SOLN
4.0000 mg | Freq: Once | INTRAMUSCULAR | Status: AC
  Administered 2019-02-05: 23:00:00 4 mg via INTRAVENOUS
  Filled 2019-02-05: qty 2

## 2019-02-05 NOTE — ED Notes (Signed)
Urine culture sent to the lab. 

## 2019-02-05 NOTE — ED Notes (Signed)
ASSUMED CARE OF PT. AAOX3. PT IN NO APPARENT DISTRESS OR PAIN. AWAITING FURTHER ORDERS. GROUP HOME ATTENDANT AT THE BEDSIDE.

## 2019-02-05 NOTE — ED Provider Notes (Signed)
[facility] DEPT Provider Note   CSN: [id number] Arrival date & time: 02/05/19  2029     History   Chief Complaint Chief Complaint  Patient presents with   Abdominal Pain    HPI Mark Escobar is a 53 y.o. male.     The history is provided by the patient and medical records.     Level 5 caveat: Mental retardation  53 y.o. M with hx of antisocial personality disorder, CKD, communication disabilities, conduct disorder, impulse control, mental retardation, asthma, seizures, presenting to the ED with abdominal pain.  Patient states he has been having pain since early this morning after eating breakfast.  He eggs and bacon, nothing out of the ordinary for him.  States his food did not taste abnormal.  Staff member states he has been shaking, sweating, and has seemed really uncomfortable all day today.  He had his morning medications but was not given anything for vomiting or diarrhea.  They have not noted any fever.  He does report his stomach feels "hard".  No prior abdominal surgeries.  Past Medical History:  Diagnosis Date   Antisocial personality disorder ([place])    CKD (chronic kidney disease), stage III ([place])    Communication disability    Conduct disorder    DM w/o complication type II ([place])    Hypertension    Impulse control disorder    Mental retardation    Mild intermittent asthma    Moderate mental retardation    Seizures ([place])     Patient Active Problem List   Diagnosis Date Noted   Adjustment disorder with disturbance of conduct 02/08/2014    History reviewed. No pertinent surgical history.      Home Medications    Prior to Admission medications   Medication Sig Start Date End Date Taking? Authorizing Provider  acetaminophen (TYLENOL) 500 MG tablet Take 500 mg by mouth every 6 (six) hours as needed.    [provider]  bismuth subsalicylate (PEPTO BISMOL) 262 MG/15ML suspension Take 30 mLs by mouth  every 6 (six) hours as needed.    [provider]  dimenhyDRINATE (DRAMAMINE) 50 MG tablet Take 50 mg by mouth every 8 (eight) hours as needed.    [provider]  famotidine (PEPCID) 10 MG tablet Take 10 mg by mouth 2 (two) times daily.    [provider]  guaiFENesin (ROBITUSSIN) 100 MG/5ML liquid Take 200 mg by mouth 3 (three) times daily as needed for cough.    [provider]  ibuprofen (ADVIL,MOTRIN) 200 MG tablet Take 200 mg by mouth every 6 (six) hours as needed.    [provider]  lisinopril (PRINIVIL,ZESTRIL) 5 MG tablet Take 5 mg by mouth daily.    [provider]  loperamide (IMODIUM) 2 MG capsule Take 2 mg by mouth as needed for diarrhea or loose stools.    [provider]  lovastatin (MEVACOR) 20 MG tablet Take 20 mg by mouth at bedtime.    [provider]  magnesium hydroxide (MILK OF MAGNESIA) 400 MG/5ML suspension Take 5 mLs by mouth daily as needed for mild constipation.    [provider]  metFORMIN (GLUCOPHAGE) 1000 MG tablet Take 1,000 mg by mouth 2 (two) times daily with a meal.    [provider]  Miconazole Nitrate (ALOE VESTA 2-N-1 ANTIFUNGAL EX) Apply topically.    [provider]  pseudoephedrine (SUDAFED) 30 MG tablet Take 30 mg by mouth every 4 (four) hours  as needed for congestion.    [provider]  terazosin (HYTRIN) 1 MG capsule Take 1 mg by mouth at bedtime.    [provider]    Family History Family History  Problem Relation Age of Onset   Pancreatic cancer Father    Rectal cancer Neg Hx    Stomach cancer Neg Hx    Colon cancer Neg Hx    Esophageal cancer Neg Hx     Social History Social History   Tobacco Use   Smoking status: Light Tobacco Smoker   Smokeless tobacco: Never Used  Substance Use Topics   Alcohol use: No   Drug use: No     Allergies   Patient has no known allergies.   Review of Systems Review of  Systems  Unable to perform ROS: Other  MENTAL RETARDATION  Physical Exam Updated Vital Signs BP (!) 147/85 (BP Location: Right Arm)    Pulse 69    Temp 98 F (36.7 C) (Oral)    Resp 18    SpO2 96%   Physical Exam Vitals signs and nursing note reviewed.  Constitutional:      Appearance: He is well-developed.     Comments: Somewhat sweaty, appears uncomfortable  HENT:     Head: Normocephalic and atraumatic.  Eyes:     Conjunctiva/sclera: Conjunctivae normal.     Pupils: Pupils are equal, round, and reactive to light.  Neck:     Musculoskeletal: Normal range of motion.  Cardiovascular:     Rate and Rhythm: Normal rate and regular rhythm.     Heart sounds: Normal heart sounds.  Pulmonary:     Effort: Pulmonary effort is normal.     Breath sounds: Normal breath sounds.  Abdominal:     General: Bowel sounds are normal.     Palpations: Abdomen is soft.     Tenderness: There is abdominal tenderness in the right lower quadrant.     Comments: Lower abdomen does seem firm but is not rigid, TTP focally in RLQ and towards the right hip  Musculoskeletal: Normal range of motion.  Skin:    General: Skin is warm and dry.  Neurological:     Mental Status: He is alert and oriented to person, place, and time.      ED Treatments / Results  Labs (all labs ordered are listed, but only abnormal results are displayed) Labs Reviewed  COMPREHENSIVE METABOLIC PANEL - Abnormal; Notable for the following components:      Result Value   Creatinine, Ser 1.32 (*)    All other components within normal limits  CBC - Abnormal; Notable for the following components:   RBC 4.13 (*)    Hemoglobin 12.4 (*)    HCT 38.5 (*)    All other components within normal limits  URINALYSIS, ROUTINE W REFLEX MICROSCOPIC - Abnormal; Notable for the following components:   Leukocytes,Ua MODERATE (*)    All other components within normal limits  LIPASE, BLOOD    EKG None  Radiology Ct Abdomen Pelvis W  Contrast  Result Date: 02/06/2019 CLINICAL DATA:  Abdominal pain, nausea vomiting diarrhea EXAM: CT ABDOMEN AND PELVIS WITH CONTRAST TECHNIQUE: Multidetector CT imaging of the abdomen and pelvis was performed using the standard protocol following bolus administration of intravenous contrast. CONTRAST:  160mL OMNIPAQUE IOHEXOL 300 MG/ML  SOLN COMPARISON:  None. FINDINGS: Lower chest: The visualized heart size within normal limits. No pericardial fluid/thickening. No hiatal hernia. The visualized portions of the lungs are clear. Hepatobiliary:  The liver is normal in density without focal abnormality.The main portal vein is patent. No evidence of calcified gallstones, gallbladder wall thickening or biliary dilatation. Pancreas: Unremarkable. No pancreatic ductal dilatation or surrounding inflammatory changes. Spleen: Normal in size without focal abnormality. Adrenals/Urinary Tract: Both adrenal glands appear normal. The kidneys and collecting system appear normal without evidence of urinary tract calculus or hydronephrosis. Renal vascular calcifications are seen. Protruding in the posterior bladder wall there is a hyperdense nodule likely extending from the superior prostate measuring 2.3 x 2.2 cm. Stomach/Bowel: The stomach, small bowel, and colon are normal in appearance. No inflammatory changes, wall thickening, or obstructive findings.The appendix is normal. No surrounding fat stranding changes. Vascular/Lymphatic: There are no enlarged mesenteric, retroperitoneal, or pelvic lymph nodes. No significant vascular findings are present. Reproductive: There is a hyperdense nodule seen protruding from the superior portion of the prostate into the posterior bladder wall. Other: No evidence of abdominal wall mass or hernia. Musculoskeletal: There is a 9 mm sclerotic lesion seen in the posterior right iliac. 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 IMPRESSION: 1. No acute  intra-abdominal or pelvic pathology. 2. Hyperdense nodule seen protruding into the posterior bladder from the prostate gland. would recommend correlation with patient's clinical laboratory exam. 3. 9 mm sclerotic lesion in the posterior right iliac. Electronically Signed   By: Prudencio Pair M.D.   On: 02/06/2019 00:53    Procedures Procedures (including critical care time)  Medications Ordered in ED Medications - No data to display   Initial Impression / Assessment and Plan / ED Course  I have reviewed the triage vital signs and the nursing notes.  Pertinent labs & imaging results that were available during my care of the patient were reviewed by me and considered in my medical decision making (see chart for details).  53 y.o. M here with N/V/D and RLQ abdominal pain since this morning.  Group home staff member here with him reports he has seemed somewhat sweaty and uncomfortable pretty much all day today.  He is afebrile, non-toxic in appearance here but does appear uncomfortable.  Lower abdomen is somewhat firm but not rigid, focally tender RLQ and towards right hip.  He does feel like his abdomen is "hard".  Labs overall reassuring.  SrCr around his baseline compared with prior.  UA with 21-50 WBC's but no bacteria seen.  He denies urinary symptoms.  Will plan for CT scan.  He was given IVF, zofran, fentanyl.  CT with findings of acute appendicitis, however he does have nodule noted along the prostate and sclerotic region of the right iliac.  This is likely what is causing his right-sided pain.  He continues urinating well here and is not having any obstructive type symptoms.  CT findings are concerning for possible neoplasm.  Patient will need close follow-up with urology for PSA testing as well as likely biopsy.  Patient seemed to have some understanding of this, I have gone over this extensively with caregiver who is at the bedside.  She reports facility will help arrange follow-up.  Also  encouraged to follow-up with PCP-- printed copies of labs and imaging studies provided with discharge paperwork for physician review.  Return here for any new/acute changes.  Final Clinical Impressions(s) / ED Diagnoses   Final diagnoses:  Prostate nodule    ED Discharge Orders    None       Larene Pickett, PA-C 02/06/19 0235  Lennice Sites, DO 02/07/19 1731

## 2019-02-05 NOTE — ED Triage Notes (Signed)
Pt reports abdominal discomfort, diarrhea, and nausea since this morning after his breakfast. Pt is from a group home.

## 2019-02-06 ENCOUNTER — Encounter (HOSPITAL_COMMUNITY): Payer: Self-pay

## 2019-02-06 ENCOUNTER — Emergency Department (HOSPITAL_COMMUNITY): Payer: Medicare Other

## 2019-02-06 DIAGNOSIS — N402 Nodular prostate without lower urinary tract symptoms: Secondary | ICD-10-CM | POA: Diagnosis not present

## 2019-02-06 MED ORDER — IOHEXOL 300 MG/ML  SOLN
100.0000 mL | Freq: Once | INTRAMUSCULAR | Status: AC | PRN
  Administered 2019-02-06: 01:00:00 100 mL via INTRAVENOUS

## 2019-02-06 NOTE — ED Notes (Signed)
Patient and caregiver were given discharge teaching and verbalized understanding. Patient ambulated out of ED with a steady gait.

## 2019-02-06 NOTE — Discharge Instructions (Signed)
CT had findings of prostate nodule and sclerotic lesion on right hip.   Will need close follow-up with urology-- call the office to schedule appt (they will likely be closed Monday for labor day). Also recommend follow-up with PCP-- copies of labs and imaging studies attached for review. Return here for any new/acute changes.

## 2020-09-02 ENCOUNTER — Emergency Department (HOSPITAL_COMMUNITY)
Admission: EM | Admit: 2020-09-02 | Discharge: 2020-09-03 | Disposition: A | Discharge status: Home / Self Care | Payer: Medicare Other | Attending: Emergency Medicine | Admitting: Emergency Medicine

## 2020-09-02 ENCOUNTER — Encounter (HOSPITAL_COMMUNITY): Payer: Self-pay | Admitting: Emergency Medicine

## 2020-09-02 ENCOUNTER — Other Ambulatory Visit: Payer: Self-pay

## 2020-09-02 DIAGNOSIS — I129 Hypertensive chronic kidney disease with stage 1 through stage 4 chronic kidney disease, or unspecified chronic kidney disease: Secondary | ICD-10-CM | POA: Diagnosis not present

## 2020-09-02 DIAGNOSIS — Z7984 Long term (current) use of oral hypoglycemic drugs: Secondary | ICD-10-CM | POA: Insufficient documentation

## 2020-09-02 DIAGNOSIS — Z20822 Contact with and (suspected) exposure to covid-19: Secondary | ICD-10-CM | POA: Insufficient documentation

## 2020-09-02 DIAGNOSIS — F17200 Nicotine dependence, unspecified, uncomplicated: Secondary | ICD-10-CM | POA: Insufficient documentation

## 2020-09-02 DIAGNOSIS — F4325 Adjustment disorder with mixed disturbance of emotions and conduct: Secondary | ICD-10-CM | POA: Insufficient documentation

## 2020-09-02 DIAGNOSIS — R451 Restlessness and agitation: Secondary | ICD-10-CM | POA: Diagnosis not present

## 2020-09-02 DIAGNOSIS — F602 Antisocial personality disorder: Secondary | ICD-10-CM | POA: Diagnosis not present

## 2020-09-02 DIAGNOSIS — F4324 Adjustment disorder with disturbance of conduct: Secondary | ICD-10-CM

## 2020-09-02 DIAGNOSIS — J4520 Mild intermittent asthma, uncomplicated: Secondary | ICD-10-CM | POA: Insufficient documentation

## 2020-09-02 DIAGNOSIS — R456 Violent behavior: Secondary | ICD-10-CM | POA: Diagnosis not present

## 2020-09-02 DIAGNOSIS — N183 Chronic kidney disease, stage 3 unspecified: Secondary | ICD-10-CM | POA: Diagnosis not present

## 2020-09-02 DIAGNOSIS — Z046 Encounter for general psychiatric examination, requested by authority: Secondary | ICD-10-CM | POA: Diagnosis not present

## 2020-09-02 DIAGNOSIS — E1122 Type 2 diabetes mellitus with diabetic chronic kidney disease: Secondary | ICD-10-CM | POA: Insufficient documentation

## 2020-09-02 DIAGNOSIS — Z79899 Other long term (current) drug therapy: Secondary | ICD-10-CM | POA: Diagnosis not present

## 2020-09-02 LAB — RESP PANEL BY RT-PCR (FLU A&B, COVID) ARPGX2
Influenza A by PCR: NEGATIVE
Influenza B by PCR: NEGATIVE
SARS Coronavirus 2 by RT PCR: NEGATIVE

## 2020-09-02 LAB — ETHANOL: Alcohol, Ethyl (B): <10 mg/dL (ref <10)

## 2020-09-02 LAB — CBC WITH DIFFERENTIAL/PLATELET
Abs Immature Granulocytes: 0.01 10*3/uL (ref 0.00–0.07)
Basophils Absolute: 0 10*3/uL (ref 0.0–0.1)
Basophils Relative: 0 %
Eosinophils Absolute: 0.1 10*3/uL (ref 0.0–0.5)
Eosinophils Relative: 1 %
HCT: 37.8 % — ABNORMAL LOW (ref 39.0–52.0)
Hemoglobin: 12.1 g/dL — ABNORMAL LOW (ref 13.0–17.0)
Immature Granulocytes: 0 %
Lymphocytes Relative: 24 %
Lymphs Abs: 1.6 10*3/uL (ref 0.7–4.0)
MCH: 28.9 pg (ref 26.0–34.0)
MCHC: 32 g/dL (ref 30.0–36.0)
MCV: 90.2 fL (ref 80.0–100.0)
Monocytes Absolute: 0.6 10*3/uL (ref 0.1–1.0)
Monocytes Relative: 8 %
Neutro Abs: 4.5 10*3/uL (ref 1.7–7.7)
Neutrophils Relative %: 67 %
Platelets: 258 10*3/uL (ref 150–400)
RBC: 4.19 MIL/uL — ABNORMAL LOW (ref 4.22–5.81)
RDW: 13.7 % (ref 11.5–15.5)
WBC: 6.8 10*3/uL (ref 4.0–10.5)
nRBC: 0 % (ref 0.0–0.2)

## 2020-09-02 LAB — RAPID URINE DRUG SCREEN, HOSP PERFORMED
Amphetamines: NOT DETECTED
Barbiturates: NOT DETECTED
Benzodiazepines: NOT DETECTED
Cocaine: NOT DETECTED
Opiates: NOT DETECTED
Tetrahydrocannabinol: NOT DETECTED

## 2020-09-02 LAB — COMPREHENSIVE METABOLIC PANEL
ALT: 10 U/L (ref 0–44)
AST: 21 U/L (ref 15–41)
Albumin: 4 g/dL (ref 3.5–5.0)
Alkaline Phosphatase: 66 U/L (ref 38–126)
Anion gap: 7 (ref 5–15)
BUN: 21 mg/dL — ABNORMAL HIGH (ref 6–20)
CO2: 26 mmol/L (ref 22–32)
Calcium: 8.5 mg/dL — ABNORMAL LOW (ref 8.9–10.3)
Chloride: 109 mmol/L (ref 98–111)
Creatinine, Ser: 1.75 mg/dL — ABNORMAL HIGH (ref 0.61–1.24)
GFR, Estimated: 46 mL/min — ABNORMAL LOW (ref >60)
Glucose, Bld: 93 mg/dL (ref 70–99)
Potassium: 3.7 mmol/L (ref 3.5–5.1)
Sodium: 142 mmol/L (ref 135–145)
Total Bilirubin: 0.6 mg/dL (ref 0.3–1.2)
Total Protein: 7 g/dL (ref 6.5–8.1)

## 2020-09-02 LAB — ACETAMINOPHEN LEVEL: Acetaminophen (Tylenol), Serum: <10 ug/mL — ABNORMAL LOW (ref 10–30)

## 2020-09-02 LAB — SALICYLATE LEVEL: Salicylate Lvl: <7 mg/dL — ABNORMAL LOW (ref 7.0–30.0)

## 2020-09-02 MED ORDER — FAMOTIDINE 20 MG PO TABS
10.0000 mg | ORAL_TABLET | Freq: Two times a day (BID) | ORAL | Status: DC
  Administered 2020-09-03: 10 mg via ORAL
  Filled 2020-09-02: qty 1

## 2020-09-02 MED ORDER — LISINOPRIL 10 MG PO TABS
5.0000 mg | ORAL_TABLET | Freq: Every day | ORAL | Status: DC
  Administered 2020-09-03: 5 mg via ORAL
  Filled 2020-09-02: qty 1

## 2020-09-02 MED ORDER — DIMENHYDRINATE 50 MG PO TABS
50.0000 mg | ORAL_TABLET | Freq: Three times a day (TID) | ORAL | Status: DC | PRN
  Filled 2020-09-02: qty 1

## 2020-09-02 MED ORDER — IBUPROFEN 200 MG PO TABS: 200.0000 mg | ORAL_TABLET | Freq: Four times a day (QID) | ORAL | Status: DC | PRN

## 2020-09-02 MED ORDER — BISMUTH SUBSALICYLATE 262 MG/15ML PO SUSP: 30.0000 mL | Freq: Four times a day (QID) | ORAL | Status: DC | PRN

## 2020-09-02 MED ORDER — PRAVASTATIN SODIUM 20 MG PO TABS: 10.0000 mg | ORAL_TABLET | Freq: Every day | ORAL | Status: DC

## 2020-09-02 MED ORDER — LOPERAMIDE HCL 2 MG PO CAPS: 2.0000 mg | ORAL_CAPSULE | ORAL | Status: DC | PRN

## 2020-09-02 MED ORDER — GUAIFENESIN 100 MG/5ML PO SOLN: 200.0000 mg | Freq: Three times a day (TID) | ORAL | Status: DC | PRN

## 2020-09-02 MED ORDER — TERAZOSIN HCL 1 MG PO CAPS
1.0000 mg | ORAL_CAPSULE | Freq: Every day | ORAL | Status: DC
  Filled 2020-09-02: qty 1

## 2020-09-02 MED ORDER — MAGNESIUM HYDROXIDE 400 MG/5ML PO SUSP: 5.0000 mL | Freq: Every day | ORAL | Status: DC | PRN

## 2020-09-02 MED ORDER — ZIPRASIDONE MESYLATE 20 MG IM SOLR
20.0000 mg | Freq: Once | INTRAMUSCULAR | Status: AC
  Administered 2020-09-02: 20 mg via INTRAMUSCULAR
  Filled 2020-09-02: qty 20

## 2020-09-02 MED ORDER — ACETAMINOPHEN 500 MG PO TABS: 500.0000 mg | ORAL_TABLET | Freq: Four times a day (QID) | ORAL | Status: DC | PRN

## 2020-09-02 MED ORDER — METFORMIN HCL 500 MG PO TABS
1000.0000 mg | ORAL_TABLET | Freq: Two times a day (BID) | ORAL | Status: DC
  Administered 2020-09-03: 1000 mg via ORAL
  Filled 2020-09-02: qty 2

## 2020-09-02 MED ORDER — LORAZEPAM 2 MG/ML IJ SOLN
2.0000 mg | Freq: Once | INTRAMUSCULAR | Status: AC
  Administered 2020-09-02: 2 mg via INTRAMUSCULAR
  Filled 2020-09-02: qty 1

## 2020-09-02 NOTE — ED Notes (Signed)
Pt placed in burgundy scrubs. All clothing and jewelry removed and placed in pt bag in dedicated cubby at nurses station

## 2020-09-02 NOTE — ED Notes (Signed)
Seizure pad applied to bed rail due to pt trying to hit his head on bed rail and wall.

## 2020-09-02 NOTE — ED Provider Notes (Signed)
[facility] DEPT Provider Note   CSN: [id number] Arrival date & time: 09/02/20  1843     History Chief Complaint  Patient presents with  . Suicidal    Mark Escobar is a 55 y.o. male.  Mark Escobar lives in a group home.  Police were called to the home, and the patient was in an argument with his caretaker.  He left his bag somewhere today, and when he became aware that he would not get his bag back, he started to display agitated and aggressive behavior.  He was harming himself by stabbing himself with anything who could find and banging his head on the windshield of the police car.  He was brought in under IVC by police.  Apparently this is happened in the past, and by chart review, I see that he was assessed by psychiatry in 2015.  The history is provided by the police. The history is limited by the condition of the patient (agitated, will only say "I want my bag.").  Mental Health Problem Presenting symptoms: aggressive behavior, agitation and self-mutilation   Patient accompanied by:  Law enforcement Degree of incapacity (severity):  Severe Onset quality:  Sudden Timing:  Constant Progression:  Unchanged Chronicity:  Recurrent Context comment:  Unknown Compliance with total regimen: does not take psychoactive medications. Relieved by:  Nothing Worsened by:  Nothing Ineffective treatments: verbal redirection. Associated symptoms comment:  Unable to obtain      Past Medical History:  Diagnosis Date  . Antisocial personality disorder ([place])   . CKD (chronic kidney disease), stage III ([place])   . Communication disability   . Conduct disorder   . DM w/o complication type II ([place])   . Hypertension   . Impulse control disorder   . Mental retardation   . Mild intermittent asthma   . Moderate mental retardation   . Seizures [facility])     Patient Active Problem List   Diagnosis Date Noted  . Adjustment disorder with disturbance of conduct  02/08/2014    History reviewed. No pertinent surgical history.     Family History  Problem Relation Age of Onset  . Pancreatic cancer Father   . Rectal cancer Neg Hx   . Stomach cancer Neg Hx   . Colon cancer Neg Hx   . Esophageal cancer Neg Hx     Social History   Tobacco Use  . Smoking status: Light Tobacco Smoker  . Smokeless tobacco: Never Used  Substance Use Topics  . Alcohol use: No  . Drug use: No    Home Medications Prior to Admission medications   Medication Sig Start Date End Date Taking? Authorizing Provider  acetaminophen (TYLENOL) 500 MG tablet Take 500 mg by mouth every 6 (six) hours as needed.    [provider]  bismuth subsalicylate (PEPTO BISMOL) 262 MG/15ML suspension Take 30 mLs by mouth every 6 (six) hours as needed.    [provider]  dimenhyDRINATE (DRAMAMINE) 50 MG tablet Take 50 mg by mouth every 8 (eight) hours as needed.    [provider]  famotidine (PEPCID) 10 MG tablet Take 10 mg by mouth 2 (two) times daily.    [provider]  guaiFENesin (ROBITUSSIN) 100 MG/5ML liquid Take 200 mg by mouth 3 (three) times daily as needed for cough.    [provider]  ibuprofen (ADVIL,MOTRIN) 200 MG tablet Take 200 mg by mouth every 6 (six) hours as needed.    [provider]  lisinopril (PRINIVIL,ZESTRIL) 5 MG tablet Take 5 mg by mouth daily.    [provider]  loperamide (IMODIUM) 2 MG capsule Take 2 mg by mouth as needed for diarrhea or loose stools.    [provider]  lovastatin (MEVACOR) 20 MG tablet Take 20 mg by mouth at bedtime.    [provider]  magnesium hydroxide (MILK OF MAGNESIA) 400 MG/5ML suspension Take 5 mLs by mouth daily as needed for mild constipation.    [provider]  metFORMIN (GLUCOPHAGE) 1000 MG tablet Take 1,000 mg by mouth 2 (two) times daily with a meal.    [provider]  Miconazole Nitrate (ALOE VESTA 2-N-1 ANTIFUNGAL EX)  Apply topically.    [provider]  pseudoephedrine (SUDAFED) 30 MG tablet Take 30 mg by mouth every 4 (four) hours as needed for congestion.    [provider]  terazosin (HYTRIN) 1 MG capsule Take 1 mg by mouth at bedtime.    [provider]    Allergies    Patient has no known allergies.  Review of Systems   Review of Systems  Unable to perform ROS: Psychiatric disorder  Psychiatric/Behavioral: Positive for agitation and self-injury.    Physical Exam Updated Vital Signs BP (!) 151/82 (BP Location: Left Arm)   Pulse 81   Temp 99.9 F (37.7 C) (Oral)   Resp 18   SpO2 95%   Physical Exam Vitals and nursing note reviewed.  Constitutional:      Appearance: Normal appearance.     Comments: Agitated, restrained by multiple law enforcement personnel  HENT:     Head: Normocephalic and atraumatic.  Eyes:     Conjunctiva/sclera: Conjunctivae normal.  Pulmonary:     Effort: Pulmonary effort is normal. No respiratory distress.  Musculoskeletal:        General: No deformity. Normal range of motion.     Cervical back: Normal range of motion.  Skin:    General: Skin is warm and dry.  Neurological:     General: No focal deficit present.     Mental Status: He is alert and oriented to person, place, and time. Mental status is at baseline.  Psychiatric:        Attention and Perception: He is inattentive.        Mood and Affect: Mood normal. Affect is labile.        Speech: He is noncommunicative.        Behavior: Behavior is uncooperative and agitated.     ED Results / Procedures / Treatments   Labs (all labs ordered are listed, but only abnormal results are displayed) Labs Reviewed  COMPREHENSIVE METABOLIC PANEL - Abnormal; Notable for the following components:      Result Value   BUN 21 (*)    Creatinine, Ser 1.75 (*)    Calcium 8.5 (*)    GFR, Estimated 46 (*)    All other components within normal limits  CBC WITH DIFFERENTIAL/PLATELET -  Abnormal; Notable for the following components:   RBC 4.19 (*)    Hemoglobin 12.1 (*)    HCT 37.8 (*)    All other components within normal limits  SALICYLATE LEVEL - Abnormal; Notable for the following components:   Salicylate Lvl <9.6 (*)    All other components within normal limits  ACETAMINOPHEN LEVEL - Abnormal; Notable for the following components:   Acetaminophen (Tylenol), Serum <10 (*)    All other components within normal limits  RESP PANEL BY RT-PCR (  FLU A&B, COVID) [id number]  ETHANOL  RAPID URINE DRUG SCREEN, HOSP PERFORMED    EKG EKG Interpretation  Date/Time:  Monday September 02 2020 21:51:28 EDT Ventricular Rate:  76 PR Interval:  194 QRS Duration: 94 QT Interval:  380 QTC Calculation: 427 R Axis:   77 Text Interpretation: Normal sinus rhythm Nonspecific T wave abnormality Abnormal ECG normal axis no acute ischemia Confirmed by Lorre Munroe ([id number]) on 09/02/2020 11:31:09 PM   Radiology No results found.  Procedures Procedures   Medications Ordered in ED Medications  acetaminophen (TYLENOL) tablet 500 mg (has no administration in time range)  bismuth subsalicylate (PEPTO BISMOL) 262 MG/15ML suspension 30 mL (has no administration in time range)  dimenhyDRINATE (DRAMAMINE) tablet 50 mg (has no administration in time range)  famotidine (PEPCID) tablet 10 mg (has no administration in time range)  guaiFENesin (ROBITUSSIN) 100 MG/5ML liquid 200 mg (has no administration in time range)  ibuprofen (ADVIL) tablet 200 mg (has no administration in time range)  lisinopril (ZESTRIL) tablet 5 mg (has no administration in time range)  loperamide (IMODIUM) capsule 2 mg (has no administration in time range)  pravastatin (PRAVACHOL) tablet 10 mg (has no administration in time range)  magnesium hydroxide (MILK OF MAGNESIA) suspension 5 mL (has no administration in time range)  metFORMIN (GLUCOPHAGE) tablet 1,000 mg (has no administration in time range)  terazosin (HYTRIN) capsule 1  mg (has no administration in time range)  ziprasidone (GEODON) injection 20 mg (20 mg Intramuscular Given 09/02/20 1959)  LORazepam (ATIVAN) injection 2 mg (2 mg Intramuscular Given 09/02/20 1958)    ED Course  I have reviewed the triage vital signs and the nursing notes.  Pertinent labs & imaging results that were available during my care of the patient were reviewed by me and considered in my medical decision making (see chart for details).    MDM Rules/Calculators/A&P                          Milon Score presented to the ED upon referral by police for aggressive, self-injurious behavior.  He does have a history of the same.  He has intellectual disability, and resides at a group home.  He was so acutely agitated upon arrival in ED that he required medication.  He is awaiting behavioral health disposition.   Final Clinical Impression(s) / ED Diagnoses Final diagnoses:  Adjustment disorder with disturbance of conduct    Rx / DC Orders ED Discharge Orders    None       Arnaldo Natal, MD 09/02/20 [phone]

## 2020-09-02 NOTE — ED Notes (Signed)
Pt hitting head on side rails, PD and security intervene to prevent pt harm to himself. MD aware and awaiting medication orders to prevent pt from harming himself.

## 2020-09-02 NOTE — ED Triage Notes (Signed)
Patient brought in by [employer] from home d/t suicidal ideation. [employer] reports upon arrival patient was attempting to use a stick to cut his wrists. Patient was also slamming his head against car door and other self-harming behaviors. Hx MR, HTN, DM.

## 2020-09-03 DIAGNOSIS — F4325 Adjustment disorder with mixed disturbance of emotions and conduct: Secondary | ICD-10-CM | POA: Diagnosis not present

## 2020-09-03 NOTE — BHH Counselor (Addendum)
TTS assessment complete. Per Dr. Dwyane Dee patient does not meet in patient care criteria and is psych cleared pending contact with legal guardian. Legal guardian/ mother  is Warehouse manager: [phone], [phone].  This counselor attempted to reach her at both numbers without success.

## 2020-09-03 NOTE — BH Assessment (Signed)
Per Mark Escobar., RN, patient is alseep, attempt made, unable to awake patient to complete TTS assessment. TTS will attempt at later time.

## 2020-09-03 NOTE — BHH Counselor (Addendum)
Per patient's mother/ legal guardian: Patient is currently being transitioned from [facility] to [facility] for services. Patient has been living independently with a care giver staying with him at night. Due to staffing issues recently they have been having to transition him to a group home on the weekends. This has disrupted his routine and caused agitation.    She confirms she will pick patient up. On the way now and will bring guardianship paperwork.

## 2020-09-03 NOTE — ED Notes (Signed)
Pt stating he swallowed a straw after sitter witnessing him place the straw in his mouth. MD made aware.

## 2020-09-03 NOTE — ED Notes (Signed)
Lab called regarding POCT Toxicology Panel. Lab stated they do not know what this order is and all toxicology screenings have been done on this pt

## 2020-09-03 NOTE — ED Notes (Signed)
Meal tray given 

## 2020-09-03 NOTE — BH Assessment (Signed)
Comprehensive Clinical Assessment (CCA) Note  09/03/2020 Mark Escobar [id number]   Disposition: Per Dr. Dwyane Dee patient does not meet in patient criteria. He is psych cleared pending legal guardian notification. [facility] ED notified.  The patient demonstrates the following risk factors for suicide: Chronic risk factors for suicide include: psychiatric disorder of IDD and demographic factors (male, >55 y/o). Acute risk factors for suicide include: N/A. Protective factors for this patient include: positive social support and positive therapeutic relationship. Considering these factors, the overall suicide risk at this point appears to be low. Patient is appropriate for outpatient follow up.  Flowsheet Row ED from 09/02/2020 in [facility] DEPT  C-SSRS RISK CATEGORY High Risk     Therefore, patient does not pose risk to self. No suicide sitter precautions recommended.  Patient is a 55 year old male presenting to [facility] ED under IVC by [employer]. Per IVC paperwork patient expressing suicidal ideation and attempting to stab himself in the arm and mouth with a stick. Patient begins banging head in patrol car.  Upon this counselor's exam patient is reluctant to participate and gives limited history. Patient has moderate IDD per chart review. He states "I want my bag." He denies SI/HI/AVH. He states he live at [facility] group home and is in their day program. He does not provide further history.  This counselor spoke with [name] at [facility] [phone]: Patient can be obsessive-compulsive about his book bag. Yesterday patient forgot his bag at the day program and was not permitted to pick up since program was closed for the day. Patient became escalated and talked about being suicidal, hitting head, attempting to hurt self with a stick.  This counselor attempted to reach patient's legal guardian, Mark Escobar, at [phone] and [phone]. She did not answer either.  HIPPA compliant voicemail left at both numbers.   Chief Complaint:  Chief Complaint  Patient presents with  . Suicidal   Visit Diagnosis: IDD  CCA Biopsychosocial Intake/Chief Complaint:  NA  Current Symptoms/Problems: NA   Patient Reported Schizophrenia/Schizoaffective Diagnosis in Past: No   Strengths: NA  Preferences: NA  Abilities: NA   Type of Services Patient Feels are Needed: NA   Initial Clinical Notes/Concerns: NA   Mental Health Symptoms Depression:  Difficulty Concentrating; Irritability   Duration of Depressive symptoms: No data recorded  Mania:  Recklessness   Anxiety:   None   Psychosis:  None   Duration of Psychotic symptoms: No data recorded  Trauma:  None   Obsessions:  None   Compulsions:  None   Inattention:  None   Hyperactivity/Impulsivity:  N/A   Oppositional/Defiant Behaviors:  N/A   Emotional Irregularity:  N/A   Other Mood/Personality Symptoms:  No data recorded   Mental Status Exam Appearance and self-care  Stature:  Average   Weight:  Average weight   Clothing:  Neat/clean   Grooming:  Normal   Cosmetic use:  None   Posture/gait:  Slumped   Motor activity:  Not Remarkable   Sensorium  Attention:  Inattentive   Concentration:  Preoccupied   Orientation:  X5   Recall/memory:  Normal   Affect and Mood  Affect:  Flat   Mood:  No data recorded  Relating  Eye contact:  None   Facial expression:  Constricted   Attitude toward examiner:  Guarded   Thought and Language  Speech flow: Soft   Thought content:  Appropriate to Mood and Circumstances   Preoccupation:  None  Hallucinations:  None   Organization:  No data recorded  [employer] of Knowledge:  Fair   Intelligence:  Below average   Abstraction:  Functional   Judgement:  Impaired   Reality Testing:  Distorted   Insight:  Lacking   Decision Making:  Impulsive   Social Functioning  Social Maturity:  Impulsive    Social Judgement:  Heedless   Stress  Stressors:  Other (Comment) (no new stressors reported)   Coping Ability:  Normal   Skill Deficits:  Communication; Decision making; Self-control   Supports:  Friends/Service system; Family     Religion: Religion/Spirituality How Might This Affect Treatment?: UTA  Leisure/Recreation: Leisure / Recreation Do You Have Hobbies?:  (UTA)  Exercise/Diet: Exercise/Diet Do You Exercise?:  (UTA) Have You Gained or Lost A Significant Amount of Weight in the Past Six Months?:  (UTA) Do You Follow a Special Diet?:  (UTA) Do You Have Any Trouble Sleeping?:  (UTA)   CCA Employment/Education Employment/Work Situation: Employment / Work Situation Employment situation: On disability Why is patient on disability: IDD How long has patient been on disability: UTA Patient's job has been impacted by current illness: No What is the longest time patient has a held a job?: NA Where was the patient employed at that time?: NA Has patient ever been in the [employer]?: No  Education: Education Is Patient Currently Attending School?: No 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) Name of [place]: UTA Did Teacher, adult education From [employer]?:  (UTA) Did You Attend College?:  (UTA) Did You Attend Graduate School?:  (UTA) Did You Have An Individualized Education Program (IIEP):  (UTA) Did You Have Any Difficulty At School?:  (UTA) Patient's Education Has Been Impacted by Current Illness:  (UTA)   CCA Family/Childhood History Family and Relationship History: Family history Marital status: Single Are you sexually active?:  (UTA) What is your sexual orientation?: UTA Has your sexual activity been affected by drugs, alcohol, medication, or emotional stress?: UTA Does patient have children?: No  Childhood History:  Childhood History By whom was/is the patient raised?: Mother Additional childhood history information: UTA Description of patient's relationship with  caregiver when they were a child: UTA Patient's description of current relationship with people who raised him/her: UTA How were you disciplined when you got in trouble as a child/adolescent?: UTA Does patient have siblings?:  (UTA) Did patient suffer any verbal/emotional/physical/sexual abuse as a child?:  (UTA) Did patient suffer from severe childhood neglect?:  (UTA) Has patient ever been sexually abused/assaulted/raped as an adolescent or adult?:  (UTA) Was the patient ever a victim of a crime or a disaster?:  (UTA) Witnessed domestic violence?:  (UTA) Has patient been affected by domestic violence as an adult?:  Special educational needs teacher)  Child/Adolescent Assessment:     CCA Substance Use Alcohol/Drug Use: Alcohol / Drug Use Pain Medications: see MAR Prescriptions: see MAR Over the Counter: see MAR History of alcohol / drug use?: No history of alcohol / drug abuse                         ASAM's:  Six Dimensions of Multidimensional Assessment  Dimension 1:  Acute Intoxication and/or Withdrawal Potential:      Dimension 2:  Biomedical Conditions and Complications:      Dimension 3:  Emotional, Behavioral, or Cognitive Conditions and Complications:     Dimension 4:  Readiness to Change:     Dimension 5:  Relapse, Continued use, or  Continued Problem Potential:     Dimension 6:  Recovery/Living Environment:     ASAM Severity Score:    ASAM Recommended Level of Treatment:     Substance use Disorder (SUD)    Recommendations for Services/Supports/Treatments:    DSM5 Diagnoses: Patient Active Problem List   Diagnosis Date Noted  . 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

## 2020-09-03 NOTE — ED Notes (Signed)
Attempt to wake pt up to speak with TTS. Pt is snoring and will not wake up enough to speak with TTS or take medications that are due

## 2021-01-10 IMAGING — CT CT ABD-PELV W/ CM
2 of 5 series · 16 of 46 positions shown, 18 images · IV contrast (omnipaque)
Comparison: None.

CLINICAL DATA: Abdominal pain, nausea vomiting diarrhea

EXAM:
CT ABDOMEN AND PELVIS WITH CONTRAST
TECHNIQUE: Multidetector CT imaging of the abdomen and pelvis was performed
using the standard protocol following bolus administration of
intravenous contrast.
CONTRAST:  100mL OMNIPAQUE IOHEXOL 300 MG/ML  SOLN

[Series 2: axial st · axial · 0.71mm/px · z∈[-592,-192]mm · 13 of 94 slices shown, 15 images]
[im 7/94  soft-tissue]
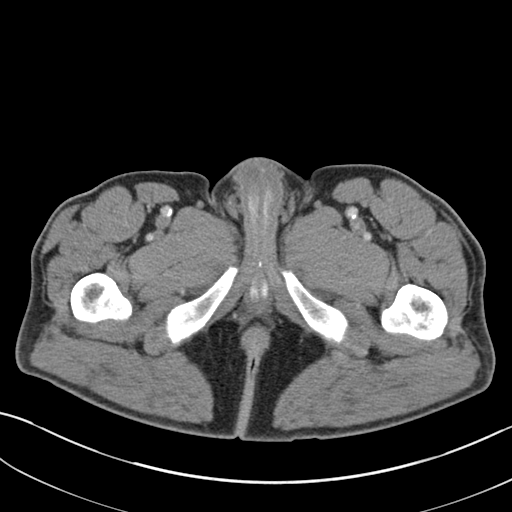
[im 7/94  bone]
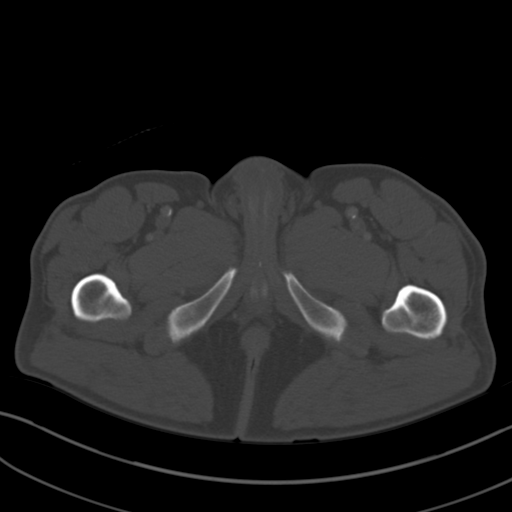
[im 13/94  soft-tissue]
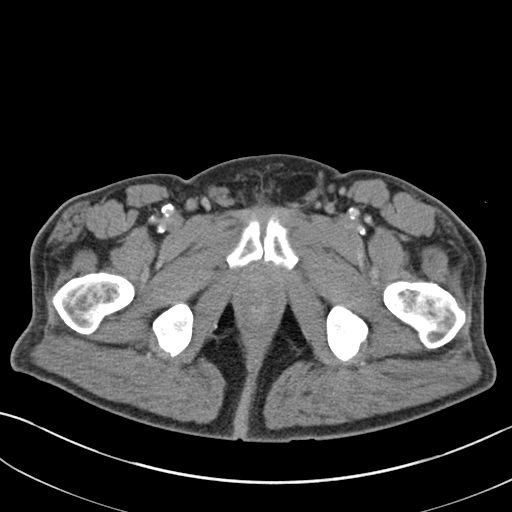
[im 19/94  soft-tissue]
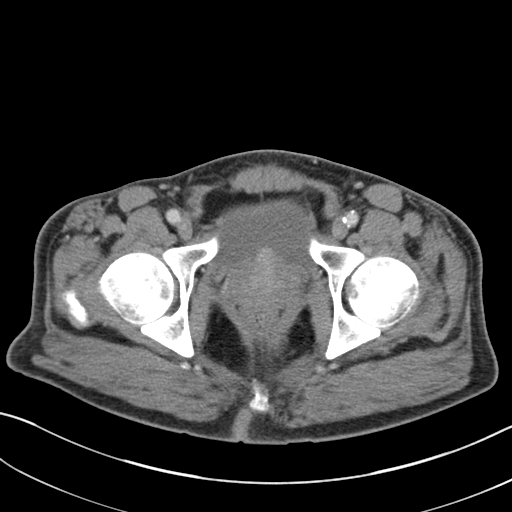
[im 25/94  soft-tissue]
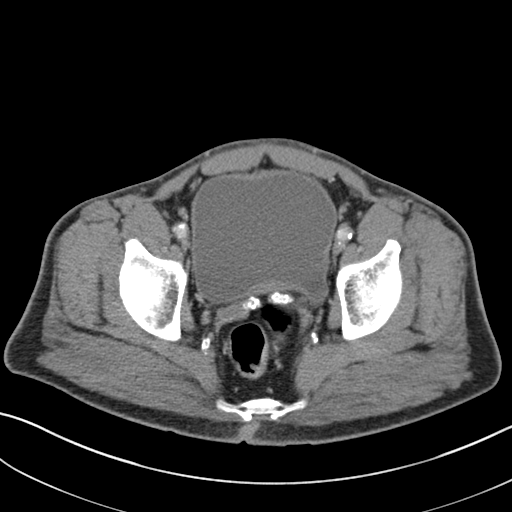
[im 32/94  soft-tissue]
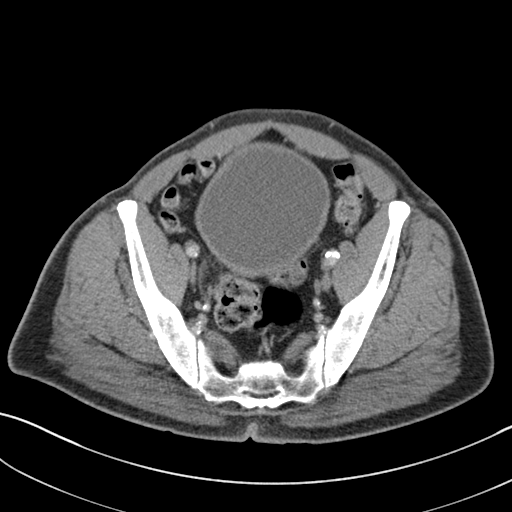
[im 38/94  soft-tissue]
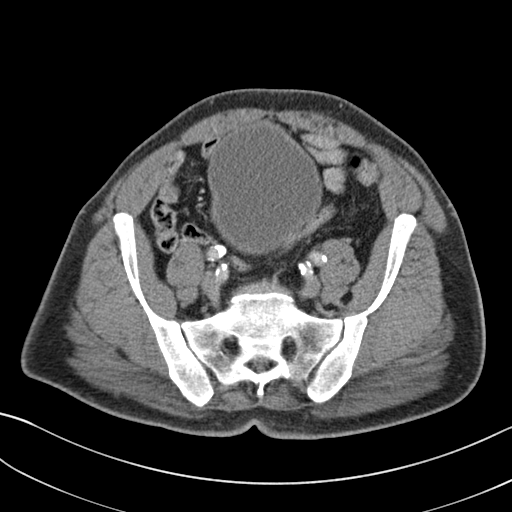
[im 50/94  soft-tissue]
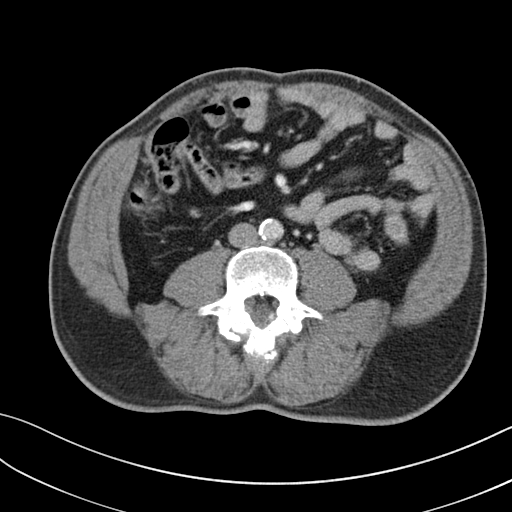
[im 56/94  soft-tissue]
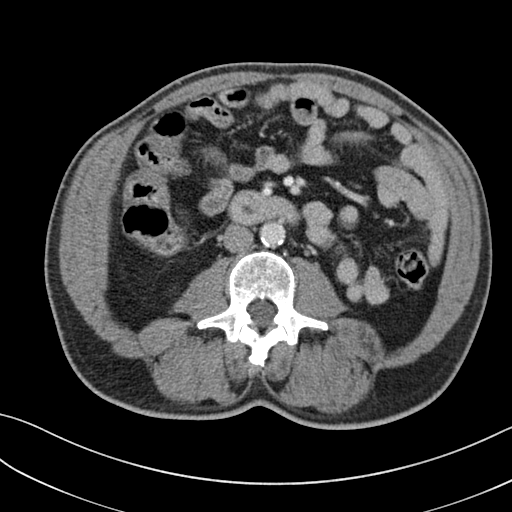
[im 63/94  soft-tissue]
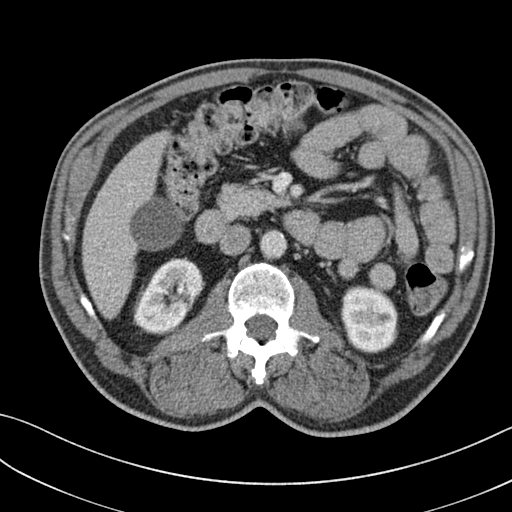
[im 63/94  bone]
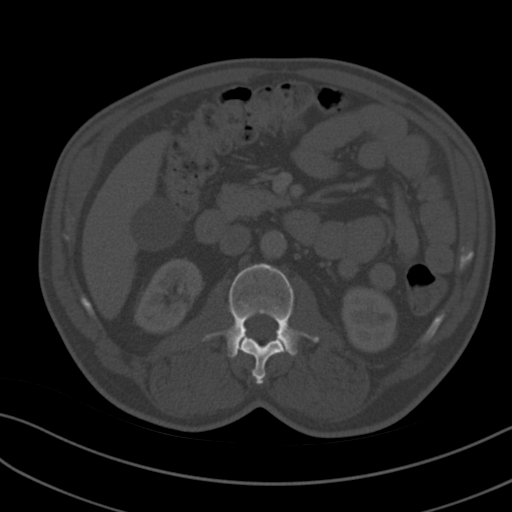
[im 69/94  soft-tissue]
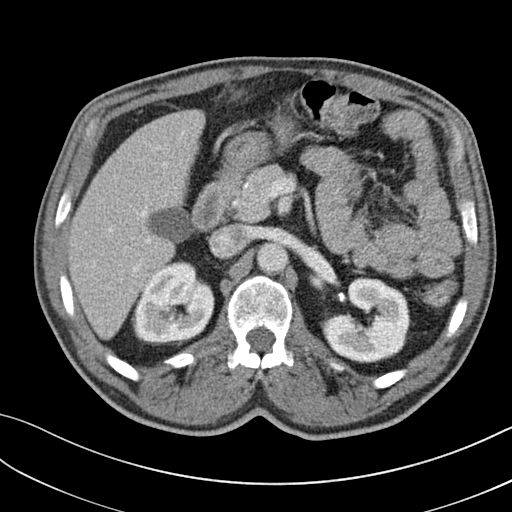
[im 75/94  soft-tissue]
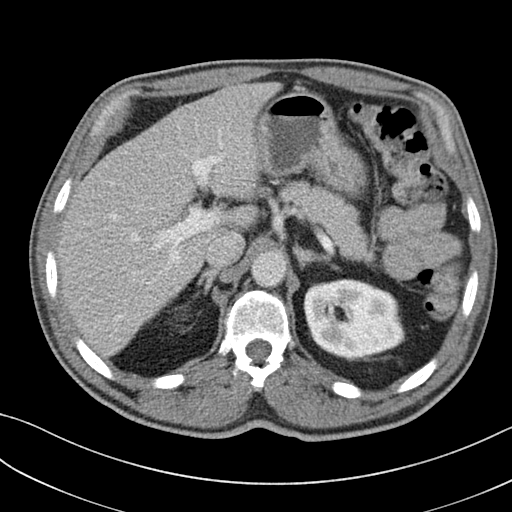
[im 81/94  soft-tissue]
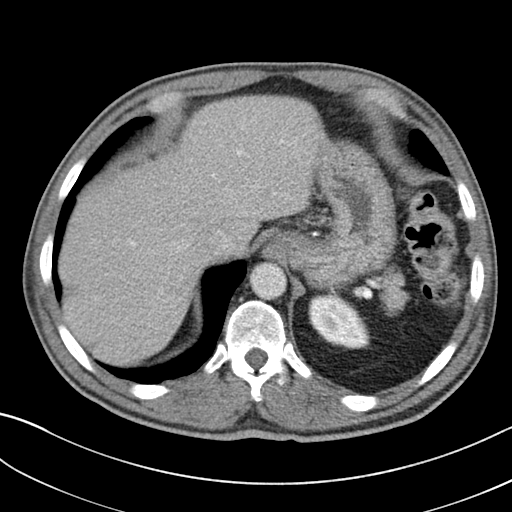
[im 87/94  soft-tissue]
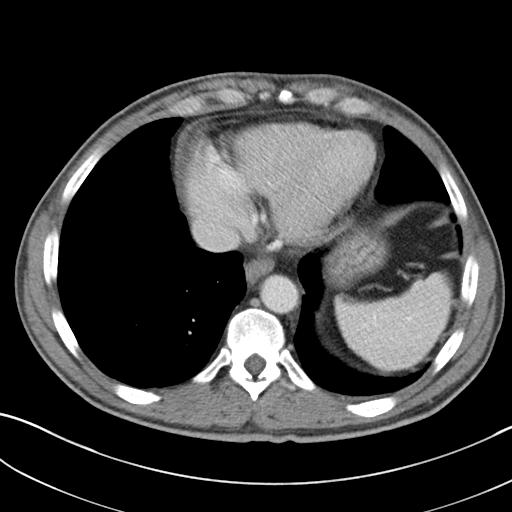

[Series 5: coronal st · coronal · 0.73mm/px · 3 of 151 slices shown]
[im 51/151  soft-tissue]
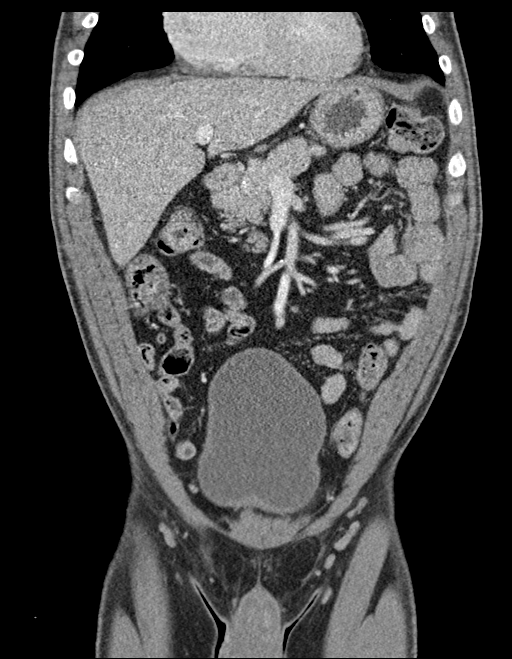
[im 67/151  soft-tissue]
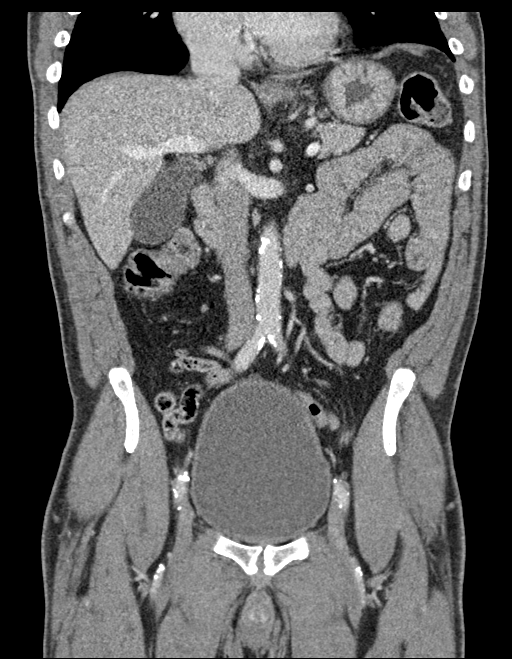
[im 84/151  soft-tissue]
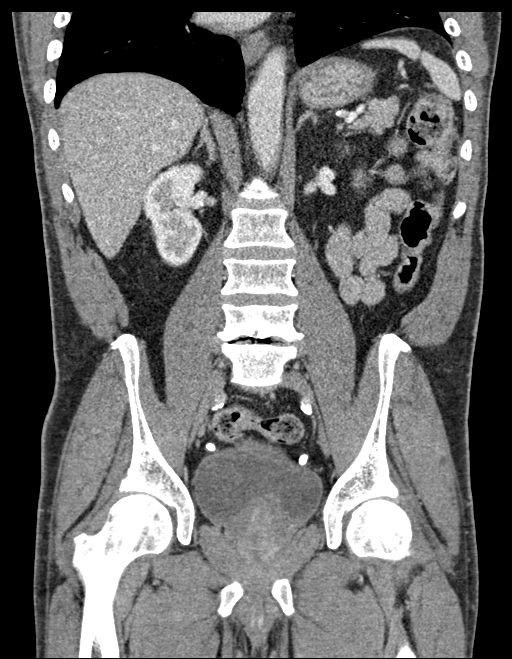

[16 of 46 positions shown; findings below may reference images not displayed]

FINDINGS: Lower chest: The visualized heart size within normal limits. No
pericardial fluid/thickening.

No hiatal hernia.

The visualized portions of the lungs are clear.

Hepatobiliary: The liver is normal in density without focal
abnormality.The main portal vein is patent. No evidence of calcified
gallstones, gallbladder wall thickening or biliary dilatation.

Pancreas: Unremarkable. No pancreatic ductal dilatation or
surrounding inflammatory changes.

Spleen: Normal in size without focal abnormality.

Adrenals/Urinary Tract: Both adrenal glands appear normal. The
kidneys and collecting system appear normal without evidence of
urinary tract calculus or hydronephrosis. Renal vascular
calcifications are seen. Protruding in the posterior bladder wall
there is a hyperdense nodule likely extending from the superior
prostate measuring 2.3 x 2.2 cm.

Stomach/Bowel: The stomach, small bowel, and colon are normal in
appearance. No inflammatory changes, wall thickening, or obstructive
findings.The appendix is normal. No surrounding fat stranding
changes.

Vascular/Lymphatic: There are no enlarged mesenteric,
retroperitoneal, or pelvic lymph nodes. No significant vascular
findings are present.

Reproductive: There is a hyperdense nodule seen protruding from the
superior portion of the prostate into the posterior bladder wall.

Other: No evidence of abdominal wall mass or hernia.

Musculoskeletal: There is a 9 mm sclerotic lesion seen in the
posterior right iliac. Chronic bilateral pars defects seen at L4 and
L5 with grade 1 anterolisthesis of L4 on L5.
IMPRESSION: 1. No acute intra-abdominal or pelvic pathology.
2. Hyperdense nodule seen protruding into the posterior bladder from
the prostate gland. would recommend correlation with patient's
clinical laboratory exam.
3. 9 mm sclerotic lesion in the posterior right iliac.

## 2022-04-05 ENCOUNTER — Emergency Department (HOSPITAL_COMMUNITY)
Admission: EM | Admit: 2022-04-05 | Discharge: 2022-04-05 | Disposition: A | Discharge status: Home / Self Care | Payer: Medicare Other | Attending: Emergency Medicine | Admitting: Emergency Medicine

## 2022-04-05 ENCOUNTER — Encounter (HOSPITAL_COMMUNITY): Payer: Self-pay

## 2022-04-05 ENCOUNTER — Other Ambulatory Visit: Payer: Self-pay

## 2022-04-05 ENCOUNTER — Emergency Department (HOSPITAL_COMMUNITY): Payer: Medicare Other

## 2022-04-05 DIAGNOSIS — Z79899 Other long term (current) drug therapy: Secondary | ICD-10-CM | POA: Insufficient documentation

## 2022-04-05 DIAGNOSIS — R569 Unspecified convulsions: Secondary | ICD-10-CM | POA: Insufficient documentation

## 2022-04-05 DIAGNOSIS — M542 Cervicalgia: Secondary | ICD-10-CM | POA: Diagnosis not present

## 2022-04-05 DIAGNOSIS — N189 Chronic kidney disease, unspecified: Secondary | ICD-10-CM | POA: Insufficient documentation

## 2022-04-05 DIAGNOSIS — R519 Headache, unspecified: Secondary | ICD-10-CM | POA: Diagnosis not present

## 2022-04-05 LAB — COMPREHENSIVE METABOLIC PANEL
ALT: 11 U/L (ref 0–44)
AST: 26 U/L (ref 15–41)
Albumin: 3.4 g/dL — ABNORMAL LOW (ref 3.5–5.0)
Alkaline Phosphatase: 53 U/L (ref 38–126)
Anion gap: 9 (ref 5–15)
BUN: 24 mg/dL — ABNORMAL HIGH (ref 6–20)
CO2: 23 mmol/L (ref 22–32)
Calcium: 8.2 mg/dL — ABNORMAL LOW (ref 8.9–10.3)
Chloride: 108 mmol/L (ref 98–111)
Creatinine, Ser: 1.98 mg/dL — ABNORMAL HIGH (ref 0.61–1.24)
GFR, Estimated: 39 mL/min — ABNORMAL LOW (ref >60)
Glucose, Bld: 177 mg/dL — ABNORMAL HIGH (ref 70–99)
Potassium: 3.6 mmol/L (ref 3.5–5.1)
Sodium: 140 mmol/L (ref 135–145)
Total Bilirubin: 0.4 mg/dL (ref 0.3–1.2)
Total Protein: 6.1 g/dL — ABNORMAL LOW (ref 6.5–8.1)

## 2022-04-05 LAB — CBC WITH DIFFERENTIAL/PLATELET
Abs Immature Granulocytes: 0.02 10*3/uL (ref 0.00–0.07)
Basophils Absolute: 0 10*3/uL (ref 0.0–0.1)
Basophils Relative: 1 %
Eosinophils Absolute: 0 10*3/uL (ref 0.0–0.5)
Eosinophils Relative: 0 %
HCT: 36.6 % — ABNORMAL LOW (ref 39.0–52.0)
Hemoglobin: 11.9 g/dL — ABNORMAL LOW (ref 13.0–17.0)
Immature Granulocytes: 1 %
Lymphocytes Relative: 25 %
Lymphs Abs: 1.1 10*3/uL (ref 0.7–4.0)
MCH: 29.8 pg (ref 26.0–34.0)
MCHC: 32.5 g/dL (ref 30.0–36.0)
MCV: 91.5 fL (ref 80.0–100.0)
Monocytes Absolute: 0.6 10*3/uL (ref 0.1–1.0)
Monocytes Relative: 12 %
Neutro Abs: 2.7 10*3/uL (ref 1.7–7.7)
Neutrophils Relative %: 61 %
Platelets: 180 10*3/uL (ref 150–400)
RBC: 4 MIL/uL — ABNORMAL LOW (ref 4.22–5.81)
RDW: 14 % (ref 11.5–15.5)
WBC: 4.4 10*3/uL (ref 4.0–10.5)
nRBC: 0 % (ref 0.0–0.2)

## 2022-04-05 LAB — CBG MONITORING, ED: Glucose-Capillary: 191 mg/dL — ABNORMAL HIGH (ref 70–99)

## 2022-04-05 MED ORDER — LACTATED RINGERS IV SOLN: INTRAVENOUS | Status: DC

## 2022-04-05 MED ORDER — LEVETIRACETAM 500 MG PO TABS: 500.0000 mg | ORAL_TABLET | Freq: Two times a day (BID) | ORAL | 0 refills | Status: DC

## 2022-04-05 MED ORDER — LEVETIRACETAM 500 MG PO TABS
500.0000 mg | ORAL_TABLET | Freq: Once | ORAL | Status: AC
  Administered 2022-04-05: 500 mg via ORAL
  Filled 2022-04-05: qty 1

## 2022-04-05 NOTE — ED Provider Notes (Addendum)
[facility] EMERGENCY DEPARTMENT Provider Note   CSN: [id number] Arrival date & time: 04/05/22  1028     History  Chief Complaint  Patient presents with   Seizures    Mark Escobar is a 56 y.o. male.  56 year old male with history of MR presents from group home after reported seizure-like activity.  Patient fell and struck his head.  Seizure activity lasted approximate 40 seconds.  Review of the old record shows that patient is not on antiepileptics at this time.  EMS was called and patient complained of having a mild headache.  He also notes of midline neck pain.  No radicular symptoms.  Patient is on blood thinners.  EMS found the patient with GCS of 15.  Her blood sugar 119.  Given 30 cc of fluid and transported here.  Patient at this time denies any focal weakness       Home Medications Prior to Admission medications   Medication Sig Start Date End Date Taking? Authorizing Provider  acetaminophen (TYLENOL) 500 MG tablet Take 500 mg by mouth every 6 (six) hours as needed.    [provider]  bismuth subsalicylate (PEPTO BISMOL) 262 MG/15ML suspension Take 30 mLs by mouth every 6 (six) hours as needed.    [provider]  dimenhyDRINATE (DRAMAMINE) 50 MG tablet Take 50 mg by mouth every 8 (eight) hours as needed.    [provider]  famotidine (PEPCID) 10 MG tablet Take 10 mg by mouth 2 (two) times daily.    [provider]  finasteride (PROSCAR) 5 MG tablet Take 5 mg by mouth daily.    [provider]  guaiFENesin (ROBITUSSIN) 100 MG/5ML liquid Take 200 mg by mouth 3 (three) times daily as needed for cough.    [provider]  ibuprofen (ADVIL,MOTRIN) 200 MG tablet Take 200 mg by mouth every 6 (six) hours as needed.    [provider]  lisinopril (PRINIVIL,ZESTRIL) 5 MG tablet Take 5 mg by mouth daily.    [provider]  loperamide (IMODIUM) 2 MG capsule Take 2 mg by mouth as needed for  diarrhea or loose stools.    [provider]  lovastatin (MEVACOR) 20 MG tablet Take 20 mg by mouth at bedtime.    [provider]  magnesium hydroxide (MILK OF MAGNESIA) 400 MG/5ML suspension Take 5 mLs by mouth daily as needed for mild constipation.    [provider]  metFORMIN (GLUCOPHAGE) 1000 MG tablet Take 1,000 mg by mouth 2 (two) times daily with a meal.    [provider]  Miconazole Nitrate (ALOE VESTA 2-N-1 ANTIFUNGAL EX) Apply topically.    [provider]  pseudoephedrine (SUDAFED) 30 MG tablet Take 30 mg by mouth every 4 (four) hours as needed for congestion.    [provider]  sitaGLIPtin (JANUVIA) 50 MG tablet Take 50 mg by mouth daily.    [provider]  terazosin (HYTRIN) 1 MG capsule Take 1 mg by mouth at bedtime.    [provider]      Allergies    Patient has no known allergies.    Review of Systems   Review of Systems  All other systems reviewed and are negative.   Physical Exam Updated Vital Signs SpO2 95%  Physical Exam Vitals and nursing note reviewed.  Constitutional:      General: He is not in acute distress.    Appearance: Normal appearance. He is well-developed. He is not toxic-appearing.  HENT:     Head: Normocephalic and atraumatic.  Eyes:     General: Lids are normal.     Conjunctiva/sclera: Conjunctivae normal.     Pupils: Pupils are equal, round, and reactive to light.  Neck:     Thyroid: No thyroid mass.     Trachea: No tracheal deviation.  Cardiovascular:     Rate and Rhythm: Normal rate and regular rhythm.     Heart sounds: Normal heart sounds. No murmur heard.    No gallop.  Pulmonary:     Effort: Pulmonary effort is normal. No respiratory distress.     Breath sounds: Normal breath sounds. No stridor. No decreased breath sounds, wheezing, rhonchi or rales.  Abdominal:     General: There is no distension.     Palpations: Abdomen is soft.     Tenderness: There  is no abdominal tenderness. There is no rebound.  Musculoskeletal:        General: No tenderness. Normal range of motion.     Cervical back: Normal range of motion and neck supple.  Skin:    General: Skin is warm and dry.     Findings: No abrasion or rash.  Neurological:     General: No focal deficit present.     Mental Status: He is alert and oriented to person, place, and time. Mental status is at baseline.     GCS: GCS eye subscore is 4. GCS verbal subscore is 5. GCS motor subscore is 6.     Cranial Nerves: No cranial nerve deficit.     Sensory: No sensory deficit.     Motor: Motor function is intact.  Psychiatric:        Attention and Perception: Attention normal.        Speech: Speech normal.        Behavior: Behavior normal.     ED Results / Procedures / Treatments   Labs (all labs ordered are listed, but only abnormal results are displayed) Labs Reviewed  CBG MONITORING, ED - Abnormal; Notable for the following components:      Result Value   Glucose-Capillary 191 (*)    All other components within normal limits  CBC WITH DIFFERENTIAL/PLATELET  COMPREHENSIVE METABOLIC PANEL    EKG EKG Interpretation  Date/Time:  Sunday April 05 2022 10:38:54 EST Ventricular Rate:  73 PR Interval:  212 QRS Duration: 78 QT Interval:  393 QTC Calculation: 433 R Axis:   73 Text Interpretation: Sinus rhythm Prolonged PR interval Abnormal R-wave progression, early transition Confirmed by Lacretia Leigh ([id number]) on 04/05/2022 10:44:04 AM  Radiology No results found.  Procedures Procedures    Medications Ordered in ED Medications  lactated ringers infusion (has no administration in time range)    ED Course/ Medical Decision Making/ A&P                           Medical Decision Making Amount and/or Complexity of Data Reviewed Labs: ordered. Radiology: ordered.  Risk Prescription drug management.   Patient's EKG per interpretation shows normal sinus rhythm.  Head  and neck CT per my review interpretation showed no acute findings.  Patient's laboratory is significant for worsening chronic kidney disease.  No evidence of acidosis.  No evidence for need for emergent nephrology consult.  Will need follow-up with primary care doctor for this.  Patient monitored here no further seizure activity noted.  Patient with prior history of seizure disorder but is not on  medication at this time.  We will start patient on Keppra.  Will discharge home with follow-up with his doctor  2:18 PM  Spoke with patient legal guardian informed her of patient's results.  She states that patient has been following up with his doctor for his chronic kidney disease.  Will come get the patient now      Final Clinical Impression(s) / ED Diagnoses Final diagnoses:  None    Rx / DC Orders ED Discharge Orders     None         Lacretia Leigh, MD 04/05/22 1411    Lacretia Leigh, MD 04/05/22 1418

## 2022-04-05 NOTE — Discharge Instructions (Addendum)
The patient's kidney function was slightly worse today.  He will need to follow-up with his doctor for this next week.  He was started on seizure medications and he is to follow-up with a neurologist.

## 2022-04-05 NOTE — ED Notes (Signed)
Attempted to call patients mother and no contact made at this time.

## 2022-04-05 NOTE — ED Notes (Signed)
Contact for his [facility] is Rubin Payor) [phone] & his mother's (Mrs. Kroft) number is [phone].

## 2022-04-05 NOTE — ED Triage Notes (Signed)
Pt BIB [facility] from group home d/t falling & then having seizure like activity fallowing the event. This was witnessed & lasted approx. 40 sec, c/o HA & nausea since then. No LOC, no thinners, GCS 15, endorses abd pain. Does have DM & CBG was 119. Was given 300 cc NS in 20g Lt hand PIV, VSS & states he had one seizure in the past also but never diagnosed with it (per pt).

## 2022-05-21 ENCOUNTER — Ambulatory Visit (INDEPENDENT_AMBULATORY_CARE_PROVIDER_SITE_OTHER): Payer: Medicare Other | Admitting: Neurology

## 2022-05-21 ENCOUNTER — Encounter: Payer: Self-pay | Admitting: Neurology

## 2022-05-21 VITALS — BP 103/62 | HR 71 | Ht 62.0 in | Wt 152.0 lb

## 2022-05-21 DIAGNOSIS — R569 Unspecified convulsions: Secondary | ICD-10-CM

## 2022-05-21 NOTE — Patient Instructions (Addendum)
Ok to hold Keppra  Continue your other medications  Follow up as needed

## 2022-05-21 NOTE — Progress Notes (Signed)
[facility]  PATIENT: Mark Escobar DOB: Mar 03, 1966  REQUESTING CLINICIAN: Leonard Downing, * HISTORY FROM: Caregiver, and chart review  REASON FOR VISIT: Seizure like episode    HISTORICAL  CHIEF COMPLAINT:  Chief Complaint  Patient presents with   Follow-up    Rm 13 with facility staff Erin  Pt is well, facility staff states this is the first eopsidode he has had a yrs. No sz like episodes since discharge.     HISTORY OF PRESENT ILLNESS:  This is a 56 year old gentleman with past medical history of hypertension, conversion disorder with seizures, who is presenting after being seen in the hospital on November 5 for seizure-like activity.  Per caregiver, patient was found awake, he was alert and responsive.  Caregiver did not actually witness the event but she was not told that patient had generalized shaking or convulsion.  Per ED note patient fell, he did have a head CT which did not show any acute cranial abnormality.  He was started on Keppra due to history of seizures in the past but per his medical fine, he has conversion disorder with seizure.  Caregiver reports patient has not been on antiseizure medication.  They started him on Keppra but they have not started it.  No other complaints or concerns.    OTHER MEDICAL CONDITIONS: Hypertension, intellectual disability, Conversion disorder with seizure  REVIEW OF SYSTEMS: Full 14 system review of systems performed and negative with exception of: As noted in the HPI   ALLERGIES: No Known Allergies  HOME MEDICATIONS: Outpatient Medications Prior to Visit  Medication Sig Dispense Refill   calamine lotion Apply topically.     dimenhyDRINATE (DRAMAMINE) 50 MG tablet Take 50 mg by mouth every 8 (eight) hours as needed.     famotidine (PEPCID) 10 MG tablet Take 10 mg by mouth 2 (two) times daily.     finasteride (PROSCAR) 5 MG tablet Take 5 mg by mouth daily.     hydrocortisone cream 1 % Apply 1  Application topically as needed for itching.     lisinopril (PRINIVIL,ZESTRIL) 5 MG tablet Take 5 mg by mouth daily.     loperamide (IMODIUM) 2 MG capsule Take 2 mg by mouth as needed for diarrhea or loose stools.     lovastatin (MEVACOR) 20 MG tablet Take 1 tablet by mouth daily.     Miconazole Nitrate (ALOE VESTA 2-N-1 ANTIFUNGAL EX) Apply topically.     sitaGLIPtin (JANUVIA) 50 MG tablet Take 50 mg by mouth daily.     acetaminophen (TYLENOL) 500 MG tablet Take 500 mg by mouth every 6 (six) hours as needed. (Patient not taking: Reported on 05/21/2022)     bismuth subsalicylate (PEPTO BISMOL) 262 MG/15ML suspension Take 30 mLs by mouth every 6 (six) hours as needed. (Patient not taking: Reported on 05/21/2022)     guaiFENesin (ROBITUSSIN) 100 MG/5ML liquid Take 200 mg by mouth 3 (three) times daily as needed for cough. (Patient not taking: Reported on 05/21/2022)     ibuprofen (ADVIL,MOTRIN) 200 MG tablet Take 200 mg by mouth every 6 (six) hours as needed. (Patient not taking: Reported on 05/21/2022)     levETIRAcetam (KEPPRA) 500 MG tablet Take 1 tablet (500 mg total) by mouth 2 (two) times daily. (Patient not taking: Reported on 05/21/2022) 60 tablet 0   lovastatin (MEVACOR) 20 MG tablet Take 20 mg by mouth at bedtime. (Patient not taking: Reported on 05/21/2022)     magnesium hydroxide (MILK OF MAGNESIA) 400  MG/5ML suspension Take 5 mLs by mouth daily as needed for mild constipation. (Patient not taking: Reported on 05/21/2022)     metFORMIN (GLUCOPHAGE) 1000 MG tablet Take 1,000 mg by mouth 2 (two) times daily with a meal. (Patient not taking: Reported on 05/21/2022)     pseudoephedrine (SUDAFED) 30 MG tablet Take 30 mg by mouth every 4 (four) hours as needed for congestion. (Patient not taking: Reported on 05/21/2022)     terazosin (HYTRIN) 1 MG capsule Take 1 mg by mouth at bedtime. (Patient not taking: Reported on 05/21/2022)     No facility-administered medications prior to visit.     PAST MEDICAL HISTORY: Past Medical History:  Diagnosis Date   Antisocial personality disorder ([place])    CKD (chronic kidney disease), stage III ([place])    Communication disability    Conduct disorder    DM w/o complication type II ([place])    Hypertension    Impulse control disorder    Mental retardation    Mild intermittent asthma    Moderate mental retardation    Seizures ([place])     PAST SURGICAL HISTORY: History reviewed. No pertinent surgical history.  FAMILY HISTORY: Family History  Problem Relation Age of Onset   Pancreatic cancer Father    Rectal cancer Neg Hx    Stomach cancer Neg Hx    Colon cancer Neg Hx    Esophageal cancer Neg Hx     SOCIAL HISTORY: Social History   Socioeconomic History   Marital status: Single    Spouse name: Not on file   Number of children: Not on file   Years of education: Not on file   Highest education level: Not on file  Occupational History   Not on file  Tobacco Use   Smoking status: Light Smoker   Smokeless tobacco: Never  Substance and Sexual Activity   Alcohol use: No   Drug use: No   Sexual activity: Never  Other Topics Concern   Not on file  Social History Narrative   Not on file   Social Determinants of Health   Financial Resource Strain: Not on file  Food Insecurity: Not on file  Transportation Needs: Not on file  Physical Activity: Not on file  Stress: Not on file  Social Connections: Not on file  Intimate Partner Violence: Not on file    PHYSICAL EXAM  GENERAL EXAM/CONSTITUTIONAL: Vitals:  Vitals:   05/21/22 1354  BP: 103/62  Pulse: 71  Weight: 152 lb (68.9 kg)  Height: '5\' 2"'$  (1.575 m)   Body mass index is 27.8 kg/m. Wt Readings from Last 3 Encounters:  05/21/22 152 lb (68.9 kg)  02/05/19 158 lb 11.7 oz (72 kg)  04/26/18 160 lb (72.6 kg)   Patient is in no distress; well developed, nourished and groomed; neck is supple  EYES: Visual fields full to confrontation, Extraocular movements  intacts,  No results found.  MUSCULOSKELETAL: Gait, strength, tone, movements noted in Neurologic exam below  NEUROLOGIC: MENTAL STATUS:      No data to display         awake, alert, not oriented to day but knows it is December and Christmas is next week  Difficulty counting the number of quarters in $1.   CRANIAL NERVE:  2nd, 3rd, 4th, 6th - Visual fields full to confrontation, extraocular muscles intact, no nystagmus 5th - facial sensation symmetric 7th - facial strength symmetric 8th - hearing intact 9th - palate elevates symmetrically, uvula midline 11th - shoulder  shrug symmetric 12th - tongue protrusion midline  MOTOR:  normal bulk and tone, full strength in the BUE, BLE  SENSORY:  normal and symmetric to light touch  COORDINATION:  finger-nose-finger, fine finger movements normal  GAIT/STATION:  normal   DIAGNOSTIC DATA (LABS, IMAGING, TESTING) - I reviewed patient records, labs, notes, testing and imaging myself where available.  Lab Results  Component Value Date   WBC 4.4 04/05/2022   HGB 11.9 (L) 04/05/2022   HCT 36.6 (L) 04/05/2022   MCV 91.5 04/05/2022   PLT 180 04/05/2022      Component Value Date/Time   NA 140 04/05/2022 1048   K 3.6 04/05/2022 1048   CL 108 04/05/2022 1048   CO2 23 04/05/2022 1048   GLUCOSE 177 (H) 04/05/2022 1048   BUN 24 (H) 04/05/2022 1048   CREATININE 1.98 (H) 04/05/2022 1048   CALCIUM 8.2 (L) 04/05/2022 1048   PROT 6.1 (L) 04/05/2022 1048   ALBUMIN 3.4 (L) 04/05/2022 1048   AST 26 04/05/2022 1048   ALT 11 04/05/2022 1048   ALKPHOS 53 04/05/2022 1048   BILITOT 0.4 04/05/2022 1048   GFRNONAA 39 (L) 04/05/2022 1048   GFRAA >60 02/05/2019 2129   No results found for: "CHOL", "HDL", "LDLCALC", "LDLDIRECT", "TRIG" No results found for: "HGBA1C" No results found for: "VITAMINB12" No results found for: "TSH"  Head CT 04/05/2022 No acute intracranial abnormality.    ASSESSMENT AND PLAN  56 y.o. year old male   with history of intellectual disability, conversion disorder with seizures, hypertension, who is presenting after seizure-like activity at home.  Per caregiver, patient was awake and interactive during the event.  In the ED he was started on Keppra but they have not start the medication.  At this time I will recommend them to hold the medication and continue to observe patient. If he does have another event to contact us.  Follow-up as needed.   1. Seizure-like activity [facility])     Patient Instructions  Ok to hold Keppra  Continue your other medications  Follow up as needed    Per [facility] statutes, patients with seizures are not allowed to drive until they have been seizure-free for six months.  Other recommendations include using caution when using heavy equipment or power tools. Avoid working on ladders or at heights. Take showers instead of baths.  Do not swim alone.  Ensure the water temperature is not too high on the home water heater. Do not go swimming alone. Do not lock yourself in a room alone (i.e. bathroom). When caring for infants or small children, sit down when holding, feeding, or changing them to minimize risk of injury to the child in the event you have a seizure. Maintain good sleep hygiene. Avoid alcohol.  Also recommend adequate sleep, hydration, good diet and minimize stress.   During the Seizure  - First, ensure adequate ventilation and place patients on the floor on their left side  Loosen clothing around the neck and ensure the airway is patent. If the patient is clenching the teeth, do not force the mouth open with any object as this can cause severe damage - Remove all items from the surrounding that can be hazardous. The patient may be oblivious to what's happening and may not even know what he or she is doing. If the patient is confused and wandering, either gently guide him/her away and block access to outside areas - Reassure the individual and be  comforting - Call 911.  In most cases, the seizure ends before EMS arrives. However, there are cases when seizures may last over 3 to 5 minutes. Or the individual may have developed breathing difficulties or severe injuries. If a pregnant patient or a person with diabetes develops a seizure, it is prudent to call an ambulance. - Finally, if the patient does not regain full consciousness, then call EMS. Most patients will remain confused for about 45 to 90 minutes after a seizure, so you must use judgment in calling for help. - Avoid restraints but make sure the patient is in a bed with padded side rails - Place the individual in a lateral position with the neck slightly flexed; this will help the saliva drain from the mouth and prevent the tongue from falling backward - Remove all nearby furniture and other hazards from the area - Provide verbal assurance as the individual is regaining consciousness - Provide the patient with privacy if possible - Call for help and start treatment as ordered by the caregiver   After the Seizure (Postictal Stage)  After a seizure, most patients experience confusion, fatigue, muscle pain and/or a headache. Thus, one should permit the individual to sleep. For the next few days, reassurance is essential. Being calm and helping reorient the person is also of importance.  Most seizures are painless and end spontaneously. Seizures are not harmful to others but can lead to complications such as stress on the lungs, brain and the heart. Individuals with prior lung problems may develop labored breathing and respiratory distress.     No orders of the defined types were placed in this encounter.   No orders of the defined types were placed in this encounter.   No follow-ups on file.    Alric Ran, MD 05/21/2022, 2:37 PM  [facility] [address] [place], [place] [address] [phone]

## 2023-04-21 ENCOUNTER — Encounter: Payer: Self-pay | Admitting: Gastroenterology

## 2024-01-06 NOTE — Progress Notes (Signed)
  [facility]  RETURN PATIENT EVALUATION   Primary Care Physician:  Alm FORBES Bilis, MD   Patient ID:  Mark Escobar is a 58 y.o. (DOB [birth year]) male.    Subjective   Patient ID:  Mark Escobar is a 58 y.o. (DOB 20-Apr-1966) male.     Patient presents with  . Follow-up     Mark Escobar is here for a follow up on OSA on CPAP.  Patient was originally diagnosed in 2025 with severe obstructive sleep apnea based on a respiratory disturbance index of 31.6 and an oxygen nadir of 59 %. He has been using CPAP at a pressure of 5-16 cm of water and returns to discuss usage and compliance.   Compliance download was pulled from 12/07/23 to 01/05/24 which shows he has been using her CPAP 7 hours per night on average. AHI is 11. Median pressure is 7.3, 95 % pressure 11.3, max pressure 13.4. He is currently using f30 mask. Reports mask is fitting ok. Median leak is 19.9  He is not snoring while using the CPAP and he is waking up refreshed.  He does intend to continue with the CPAP. His Epworth sleepiness score is 19 out of 24.  He denies any issues with driving drowsy but does understand the dangers and knows to pull over.   Past Medical History, Past Surgery History, Allergies, Social History, and Family History were reviewed and updated.    Review of Systems is complete and negative except as noted.  Objective   BP 122/78   Pulse 57   Temp 96.8 F (36 C) (Skin)   Resp 16   Ht 5' 4 (1.626 m)   Wt 170 lb (77.1 kg)   SpO2 94%   BMI 29.18 kg/m  General:  Well Developed, Well Nourished, No distress, Well-groomed and pleasant. HEENT:     Mallampati 4. He has an elevated hard palate without some scalloping noted bilateral to the tongue Neck:  Neck circumference is 17 inches CV:   RRR without Murmur  Lungs:   clear to auscultation with normal effort Neuro:  alert, oriented, and ambulating without difficulty.  Memory intact.   Assessment   58 y.o.  male with PMHx as above who presents for follow up of OSA on CPAP.  He has been compliant with PAP therapy since getting his machine.  Unfortunately he has not had a full 30 days yet.  His AHI remains elevated at 11.  This is an improvement from the 30 to he was on his original leap study.  Some of this may be related to mask leak.  I have recommended patient continue to wear his CPAP machine every night we will follow-up in another 4 to 5 weeks to see if there is improvement. Patient continues to benefit from PAP therapy and it remains medically necessary  Plan     OSA Compliant with PAP therapy AHI remains elevated Recheck download in 4 to 5 weeks   Risks, benefits, and alternatives of the medications and treatment plan prescribed today were discussed, and patient expressed understanding. Plan follow-up as discussed or as needed if any worsening symptoms or change in condition.      Electronically Signed: Golda Sis, FNP-C [facility]  01/06/2024 12:18 PM

## 2024-01-19 ENCOUNTER — Encounter: Payer: Self-pay | Admitting: Podiatry

## 2024-01-19 ENCOUNTER — Ambulatory Visit: Admitting: Podiatry

## 2024-01-19 VITALS — Ht 62.0 in | Wt 152.0 lb

## 2024-01-19 DIAGNOSIS — L84 Corns and callosities: Secondary | ICD-10-CM

## 2024-01-19 DIAGNOSIS — M79671 Pain in right foot: Secondary | ICD-10-CM | POA: Diagnosis not present

## 2024-01-19 DIAGNOSIS — M79672 Pain in left foot: Secondary | ICD-10-CM

## 2024-01-19 DIAGNOSIS — B351 Tinea unguium: Secondary | ICD-10-CM

## 2024-01-19 NOTE — Progress Notes (Signed)
 Patient presents for evaluation and treatment of tenderness and some redness around nails feet.  Tenderness around toes with walking and wearing shoes.  Tender painful lesions on the fifth toe on the right and on the plantar aspect of the left foot.  These bother him with walking and wearing shoes.  Does not recall any injury to the areas.  Physical exam:  General appearance: Alert, pleasant, and in no acute distress.  Vascular: Pedal pulses: DP 2/4 B/L, PT 0/4 B/L.  Mild to moderate edema lower legs bilaterally  Neurological:  Light touch intact bilaterally.  Achilles tendon reflex normal bilaterally.  No clonus or spasticity noted.  Dermatologic:  Nails thickened, disfigured, discolored 1-5 BL with subungual debris, except for hallux bilaterally left.  Redness and hypertrophic nail folds along nail folds bilaterally but no signs of drainage or infection.  Skin thin and atrophic with no hair growth on the lower extremity bilaterally.  Areas of skin hyperpigmentation bilaterally  Musculoskeletal:  Hammertoes 2 through 5 bilaterally.   Diagnosis: 1. Painful onychomycotic nails 1 through 5 bilaterally, except for hallux nails bilaterally and third left. 2. Pain toes 1 through 5 and foot bilaterally. 3.  Painful hyperkeratotic lesion fifth toe right and subfifth MTP left 4.   Q8 modifier PVD  Plan: -Debrided onychomycotic nails 1 through 5 bilaterally, except for hallux bilaterally and third toe with nail noticed nippers and reduced with power bur. -Sharply debrided hyperkeratotic lesion fifth toe right and subfifth MTP left with a 312 blade.  Lesions were debrided without incident.  Return 3 months

## 2024-02-02 ENCOUNTER — Ambulatory Visit (INDEPENDENT_AMBULATORY_CARE_PROVIDER_SITE_OTHER): Admitting: Podiatry

## 2024-02-02 ENCOUNTER — Encounter: Payer: Self-pay | Admitting: Podiatry

## 2024-02-02 DIAGNOSIS — B070 Plantar wart: Secondary | ICD-10-CM | POA: Diagnosis not present

## 2024-02-02 NOTE — Progress Notes (Signed)
 Patient presents follow-up verrucous lesion plantar foot right.  Doing better not as painful as before.   Physical exam:  General appearance: Pleasant, and in no acute distress. AOx3.  Vascular: Pedal pulses: DP 2/4 bilaterally, PT 0/4 bilaterally.  Mild to moderate edema lower legs bilaterally. Capillary fill time immediate bilaterally.  Neurological: Ossey intact bilaterally Dermatologic:   Verruca lesion plantar right foot much improved.  Almost resolved.  Some tenderness with lateral pressure on the lesion.  Musculoskeletal:      Diagnosis: 1. plantar verruca right foot  Plan: -Responded well to first Salinocaine treatment on verrucous lesion plantar foot right.  Will do a second second application of Salinocaine -Applied Salinocaine compound to lesion(s) as noted in physical exam after debriding lesions to pinpoint bleeding.  Salinocaine applied to lesion(s) and covered with an occlusive dressing with Coban wrap.  Written and oral instructions given to patient.  Return as scheduled in 2-1/2 months for RFC

## 2024-02-18 ENCOUNTER — Telehealth: Admitting: Physician Assistant

## 2024-02-18 DIAGNOSIS — H10021 Other mucopurulent conjunctivitis, right eye: Secondary | ICD-10-CM

## 2024-02-18 MED ORDER — POLYMYXIN B-TRIMETHOPRIM 10000-0.1 UNIT/ML-% OP SOLN: 1.0000 [drp] | OPHTHALMIC | 0 refills | Status: AC

## 2024-02-18 NOTE — Progress Notes (Signed)

## 2024-02-18 NOTE — Progress Notes (Signed)
 I have spent 5 minutes in review of e-visit questionnaire, review and updating patient chart, medical decision making and response to patient.   Laure Kidney, PA-C

## 2024-04-12 ENCOUNTER — Ambulatory Visit (INDEPENDENT_AMBULATORY_CARE_PROVIDER_SITE_OTHER): Admitting: Podiatry

## 2024-04-12 ENCOUNTER — Encounter: Payer: Self-pay | Admitting: Podiatry

## 2024-04-12 DIAGNOSIS — B351 Tinea unguium: Secondary | ICD-10-CM

## 2024-04-12 DIAGNOSIS — M79672 Pain in left foot: Secondary | ICD-10-CM | POA: Diagnosis not present

## 2024-04-12 DIAGNOSIS — M79671 Pain in right foot: Secondary | ICD-10-CM | POA: Diagnosis not present

## 2024-04-12 NOTE — Progress Notes (Addendum)
 Patient presents for evaluation and treatment of tenderness and some redness around nails feet.  Tenderness around toes with walking and wearing shoes.  Physical exam:  General appearance: Alert, pleasant, and in no acute distress.  Vascular: Pedal pulses: DP 2/4 B/L, PT 0/4 B/L.  Mild to moderate edema lower legs bilaterally.  Capillary refill time immediate bilaterally  Neurologic:  Dermatologic:  Nails thickened, disfigured, discolored 1-5 BL with subungual debris.  Redness and hypertrophic nail folds along nail folds bilaterally but no signs of drainage or infection.  Musculoskeletal:     Diagnosis: 1. Painful onychomycotic nails 1 through 5 bilaterally. 2. Pain toes 1 through 5 bilaterally.  Plan: -Debrided onychomycotic nails 1 through 5 bilaterally.  Sharply debrided nails with nail clipper and reduced with a power bur.  Return 3 months [facility]

## 2024-07-14 ENCOUNTER — Ambulatory Visit: Admitting: Podiatry
